# Patient Record
Sex: Male | Born: 1980 | Race: Black or African American | Hispanic: No | Marital: Single | Smoking: Current every day smoker
Health system: Southern US, Community
[De-identification: ages and names within clinical notes are randomized; demographics above are authoritative.]

## PROBLEM LIST (undated history)

## (undated) DIAGNOSIS — Z21 Asymptomatic human immunodeficiency virus [HIV] infection status: Secondary | ICD-10-CM

## (undated) DIAGNOSIS — Z7189 Other specified counseling: Secondary | ICD-10-CM

## (undated) DIAGNOSIS — B20 Human immunodeficiency virus [HIV] disease: Secondary | ICD-10-CM

## (undated) HISTORY — DX: Human immunodeficiency virus (HIV) disease: B20

## (undated) HISTORY — DX: Other specified counseling: Z71.89

## (undated) HISTORY — PX: NO PAST SURGERIES: SHX2092

## (undated) HISTORY — DX: Asymptomatic human immunodeficiency virus (hiv) infection status: Z21

---

## 2004-03-19 ENCOUNTER — Emergency Department (HOSPITAL_COMMUNITY): Admission: EM | Admit: 2004-03-19 | Discharge: 2004-03-19 | Payer: Self-pay | Admitting: Emergency Medicine

## 2009-05-07 ENCOUNTER — Emergency Department (HOSPITAL_COMMUNITY): Admission: EM | Admit: 2009-05-07 | Discharge: 2009-05-07 | Payer: Self-pay | Admitting: Emergency Medicine

## 2009-05-20 ENCOUNTER — Emergency Department (HOSPITAL_COMMUNITY): Admission: EM | Admit: 2009-05-20 | Discharge: 2009-05-20 | Payer: Self-pay | Admitting: Emergency Medicine

## 2010-09-16 LAB — DIFFERENTIAL
Eosinophils Relative: 1 % (ref 0–5)
Lymphocytes Relative: 28 % (ref 12–46)
Lymphs Abs: 1.6 10*3/uL (ref 0.7–4.0)
Monocytes Relative: 11 % (ref 3–12)
Neutrophils Relative %: 60 % (ref 43–77)

## 2010-09-16 LAB — URINALYSIS, ROUTINE W REFLEX MICROSCOPIC
Bilirubin Urine: NEGATIVE
Hgb urine dipstick: NEGATIVE
Ketones, ur: NEGATIVE mg/dL
Nitrite: NEGATIVE
Protein, ur: NEGATIVE mg/dL
Specific Gravity, Urine: 1.029 (ref 1.005–1.030)
Urobilinogen, UA: 1 mg/dL (ref 0.0–1.0)

## 2010-09-16 LAB — CBC
MCHC: 33.9 g/dL (ref 30.0–36.0)
MCV: 92.9 fL (ref 78.0–100.0)
RBC: 4.61 MIL/uL (ref 4.22–5.81)
RDW: 13 % (ref 11.5–15.5)

## 2010-09-16 LAB — COMPREHENSIVE METABOLIC PANEL
AST: 38 U/L — ABNORMAL HIGH (ref 0–37)
CO2: 27 mEq/L (ref 19–32)
Calcium: 8.5 mg/dL (ref 8.4–10.5)
Creatinine, Ser: 0.99 mg/dL (ref 0.4–1.5)
GFR calc Af Amer: >60 mL/min (ref >60)
GFR calc non Af Amer: >60 mL/min (ref >60)
Total Protein: 6.6 g/dL (ref 6.0–8.3)

## 2010-09-16 LAB — RPR: RPR Ser Ql: NONREACTIVE

## 2010-09-16 LAB — CHLAMYDIA TRACHOMATIS CULTURE

## 2010-09-16 LAB — GC/CHLAMYDIA PROBE AMP, GENITAL
Chlamydia, DNA Probe: NEGATIVE
GC Probe Amp, Genital: NEGATIVE

## 2010-09-16 LAB — GONOCOCCUS CULTURE: Culture: NO GROWTH

## 2011-10-07 ENCOUNTER — Encounter (HOSPITAL_COMMUNITY): Payer: Self-pay | Admitting: Emergency Medicine

## 2011-10-07 ENCOUNTER — Emergency Department (HOSPITAL_COMMUNITY)
Admission: EM | Admit: 2011-10-07 | Discharge: 2011-10-07 | Disposition: A | Discharge status: Home / Self Care | Payer: Self-pay | Attending: Emergency Medicine | Admitting: Emergency Medicine

## 2011-10-07 ENCOUNTER — Emergency Department (HOSPITAL_COMMUNITY): Payer: Self-pay

## 2011-10-07 DIAGNOSIS — J189 Pneumonia, unspecified organism: Secondary | ICD-10-CM | POA: Insufficient documentation

## 2011-10-07 LAB — POCT I-STAT, CHEM 8
Calcium, Ion: 1.07 mmol/L — ABNORMAL LOW (ref 1.12–1.32)
HCT: 42 % (ref 39.0–52.0)
TCO2: 26 mmol/L (ref 0–100)

## 2011-10-07 LAB — DIFFERENTIAL
Basophils Absolute: 0 10*3/uL (ref 0.0–0.1)
Basophils Relative: 0 % (ref 0–1)
Eosinophils Absolute: 0 10*3/uL (ref 0.0–0.7)
Eosinophils Relative: 0 % (ref 0–5)
Monocytes Absolute: 0.6 10*3/uL (ref 0.1–1.0)

## 2011-10-07 LAB — CBC
HCT: 39.9 % (ref 39.0–52.0)
MCHC: 35.3 g/dL (ref 30.0–36.0)
MCV: 90.1 fL (ref 78.0–100.0)
RDW: 12.3 % (ref 11.5–15.5)

## 2011-10-07 MED ORDER — SODIUM CHLORIDE 0.9 % IV BOLUS (SEPSIS)
1000.0000 mL | Freq: Once | INTRAVENOUS | Status: AC
  Administered 2011-10-07: 1000 mL via INTRAVENOUS

## 2011-10-07 MED ORDER — ACETAMINOPHEN 325 MG PO TABS
650.0000 mg | ORAL_TABLET | Freq: Once | ORAL | Status: DC
  Administered 2011-10-07: 650 mg via ORAL
  Filled 2011-10-07: qty 2

## 2011-10-07 MED ORDER — AZITHROMYCIN 250 MG PO TABS
500.0000 mg | ORAL_TABLET | Freq: Once | ORAL | Status: AC
  Administered 2011-10-07: 500 mg via ORAL
  Filled 2011-10-07: qty 2

## 2011-10-07 MED ORDER — IBUPROFEN 200 MG PO TABS: 600.0000 mg | ORAL_TABLET | Freq: Once | ORAL | Status: DC

## 2011-10-07 MED ORDER — AZITHROMYCIN 250 MG PO TABS: 250.0000 mg | ORAL_TABLET | Freq: Every day | ORAL | Status: AC

## 2011-10-07 NOTE — ED Notes (Signed)
Pt had already received tylenol prior to MD discontinuing the med and changing to ibuprofen

## 2011-10-07 NOTE — ED Notes (Signed)
MD resident at bedside to discuss disposition.

## 2011-10-07 NOTE — ED Notes (Signed)
Patient transported to X-ray 

## 2011-10-07 NOTE — ED Provider Notes (Signed)
History     CSN: [id number]  Arrival date & time 10/07/11  2001   First MD Initiated Contact with Patient 10/07/11 2050      Chief Complaint  Patient presents with  . Emesis    (Consider location/radiation/quality/duration/timing/severity/associated sxs/prior treatment) Patient is a 31 y.o. male presenting with general illness. The history is provided by the patient.  Illness  The current episode started 2 days ago. The problem occurs continuously. The problem has been unchanged. The problem is moderate. The symptoms are relieved by one or more OTC medications. The symptoms are aggravated by activity. Associated symptoms include a fever, diarrhea (single episode today), nausea, vomiting (x 2 today), rhinorrhea, muscle aches and cough. Pertinent negatives include no abdominal pain, no constipation, no sore throat, no swollen glands, no neck pain, no neck stiffness and no rash. The emesis has an appearance of stomach contents. He has been less active. He has been eating and drinking normally. There were no sick contacts. He has received no recent medical care.    No past medical history on file.  No past surgical history on file.  No family history on file.  History  Substance Use Topics  . Smoking status: Not on file  . Smokeless tobacco: Not on file  . Alcohol Use: Not on file      Review of Systems  Constitutional: Positive for fever and fatigue.  HENT: Positive for rhinorrhea. Negative for sore throat and neck pain.   Respiratory: Positive for cough.   Gastrointestinal: Positive for nausea, vomiting (x 2 today) and diarrhea (single episode today). Negative for abdominal pain and constipation.  Musculoskeletal: Positive for myalgias.  Skin: Negative for rash.  All other systems reviewed and are negative.    Allergies  Review of patient's allergies indicates no known allergies.  Home Medications   Current Outpatient Rx  Name Route Sig Dispense Refill  . OVER THE  COUNTER MEDICATION Oral Take 10 mLs by mouth every 4 (four) hours. Tylenol cold and flu    . PSEUDOEPH-DOXYLAMINE-DM-APAP 60-7.11-10-998 MG/30ML PO LIQD Oral Take 10 mLs by mouth every 4 (four) hours.      BP 130/67  Pulse 94  Temp(Src) 100.4 F (38 C) (Oral)  Resp 24  SpO2 96%  Physical Exam  Constitutional: He is oriented to person, place, and time. He appears well-developed and well-nourished.  HENT:  Head: Normocephalic and atraumatic.  Right Ear: Tympanic membrane normal.  Left Ear: Tympanic membrane normal.  Mouth/Throat: Oropharynx is clear and moist. No oropharyngeal exudate.  Eyes: Conjunctivae and EOM are normal. Pupils are equal, round, and reactive to light.  Neck: Normal range of motion. Neck supple.  Cardiovascular: Normal rate, regular rhythm and normal heart sounds.   Pulmonary/Chest: Effort normal and breath sounds normal. No respiratory distress.  Abdominal: Soft. He exhibits no distension. There is no tenderness. There is no rigidity and no guarding.  Musculoskeletal: Normal range of motion.  Neurological: He is alert and oriented to person, place, and time.  Skin: Skin is warm and dry.  Psychiatric: He has a normal mood and affect.    ED Course  Procedures (including critical care time)   Labs Reviewed  CBC  DIFFERENTIAL   Results for orders placed during the hospital encounter of 10/07/11  CBC      Component Value Range   WBC 8.0  4.0 - 10.5 (K/uL)   RBC 4.43  4.22 - 5.81 (MIL/uL)   Hemoglobin 14.1  13.0 - 17.0 (g/dL)  HCT 39.9  39.0 - 52.0 (%)   MCV 90.1  78.0 - 100.0 (fL)   MCH 31.8  26.0 - 34.0 (pg)   MCHC 35.3  30.0 - 36.0 (g/dL)   RDW 16.1  09.6 - 04.5 (%)   Platelets 242  150 - 400 (K/uL)  DIFFERENTIAL      Component Value Range   Neutrophils Relative 70  43 - 77 (%)   Neutro Abs 5.6  1.7 - 7.7 (K/uL)   Lymphocytes Relative 22  12 - 46 (%)   Lymphs Abs 1.7  0.7 - 4.0 (K/uL)   Monocytes Relative 8  3 - 12 (%)   Monocytes Absolute 0.6   0.1 - 1.0 (K/uL)   Eosinophils Relative 0  0 - 5 (%)   Eosinophils Absolute 0.0  0.0 - 0.7 (K/uL)   Basophils Relative 0  0 - 1 (%)   Basophils Absolute 0.0  0.0 - 0.1 (K/uL)  POCT I-STAT, CHEM 8      Component Value Range   Sodium 137  135 - 145 (mEq/L)   Potassium 3.8  3.5 - 5.1 (mEq/L)   Chloride 105  96 - 112 (mEq/L)   BUN 7  6 - 23 (mg/dL)   Creatinine, Ser 4.09  0.50 - 1.35 (mg/dL)   Glucose, Bld 811 (*) 70 - 99 (mg/dL)   Calcium, Ion 9.14 (*) 1.12 - 1.32 (mmol/L)   TCO2 26  0 - 100 (mmol/L)   Hemoglobin 14.3  13.0 - 17.0 (g/dL)   HCT 78.2  95.6 - 21.3 (%)    Dg Chest 2 View  10/07/2011  *RADIOLOGY REPORT*  Clinical Data: Fever and vomiting  CHEST - 2 VIEW  Comparison: None.  Findings: Diffuse bilateral airspace disease is present, most prominent in the bases.  No effusion.  Lung volume is normal. Heart size is mildly enlarged.  IMPRESSION: Bilateral airspace disease, probable pneumonia.  Original Report Authenticated By: Camelia Phenes, M.D.     1. Community acquired pneumonia       MDM   31 y/o M presents with day 3 of illness. Began with h/a, muscle aches, chills. Has been having fevers at home to Tmax 101. Also runny nose, cough. Cough rarely productive. No rash. So noted swollen lymph nodes.  Exam benign here and patient without distress. No focal source of bacterial infection on exam.   Labs benign.  CXR with concern of PNA. Fits with clinical picture. Likely atypical infection given appearance, age, presentation. Will start on azithro. Pt without hypoxia/tachypnea/tachycardia or comorbidities. Stable for outpt treatment.           Donnamarie Poag, MD 10/07/11 2151

## 2011-10-07 NOTE — ED Notes (Signed)
MD at bedside. 

## 2011-10-07 NOTE — ED Notes (Signed)
Pt reporting fevers and feeling malaise since Tuesday. Vomiting and diarrh starting today. Body aches, chills.

## 2011-10-09 NOTE — ED Provider Notes (Signed)
I saw and evaluated the patient, reviewed the resident's note and I agree with the findings and plan.   .Face to face Exam:  General:  Awake HEENT:  Atraumatic Resp:  Normal effort Abd:  Nondistended Neuro:No focal weakness Lymph: No adenopathy   Vendela Troung L Sian Rockers, MD 10/09/11 0908 

## 2013-07-12 IMAGING — CR DG CHEST 2V
2 series · 2 of 2 positions shown · non-contrast
Comparison: None.

CLINICAL DATA: Fever and vomiting

CHEST - 2 VIEW

[w chest pa]
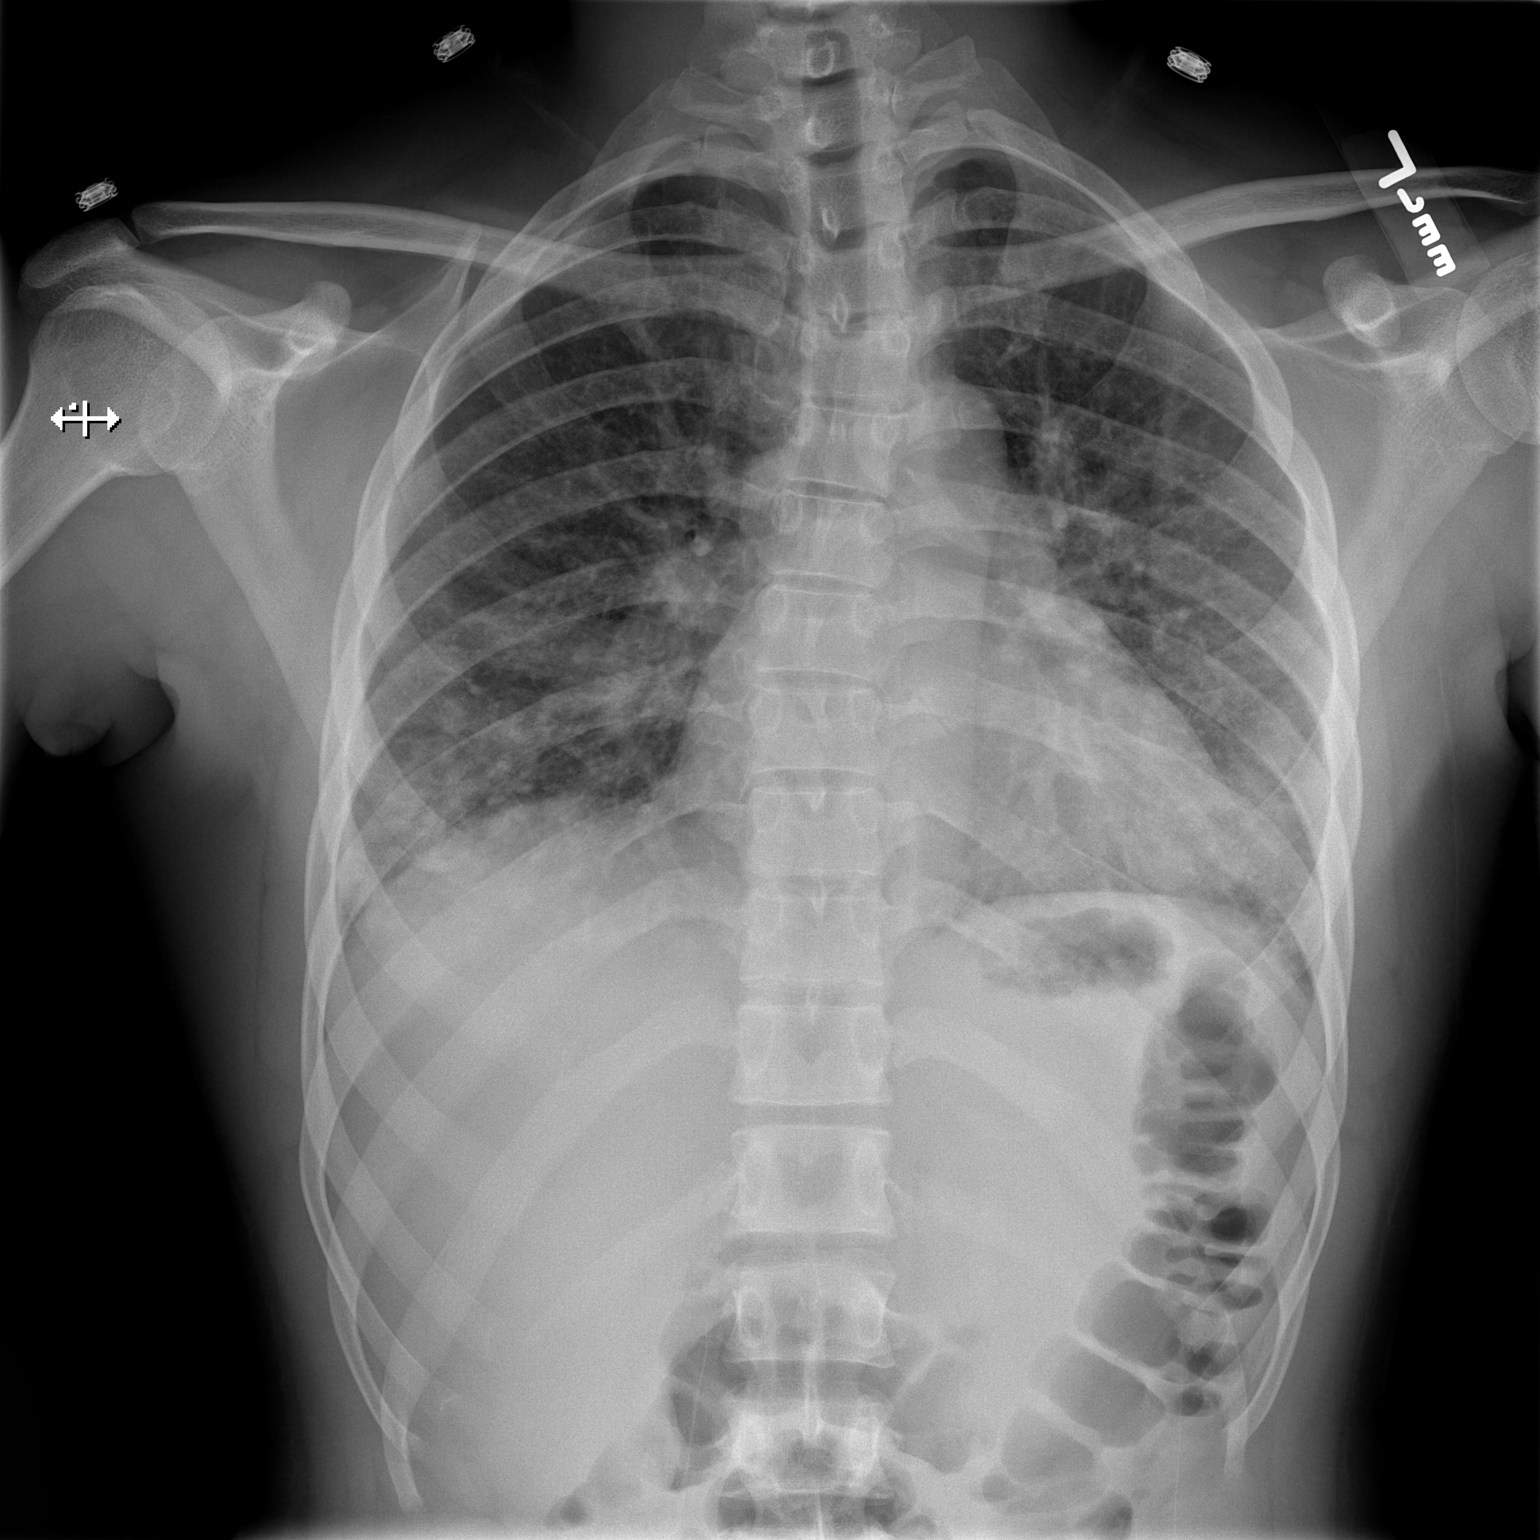

[w chest lat]
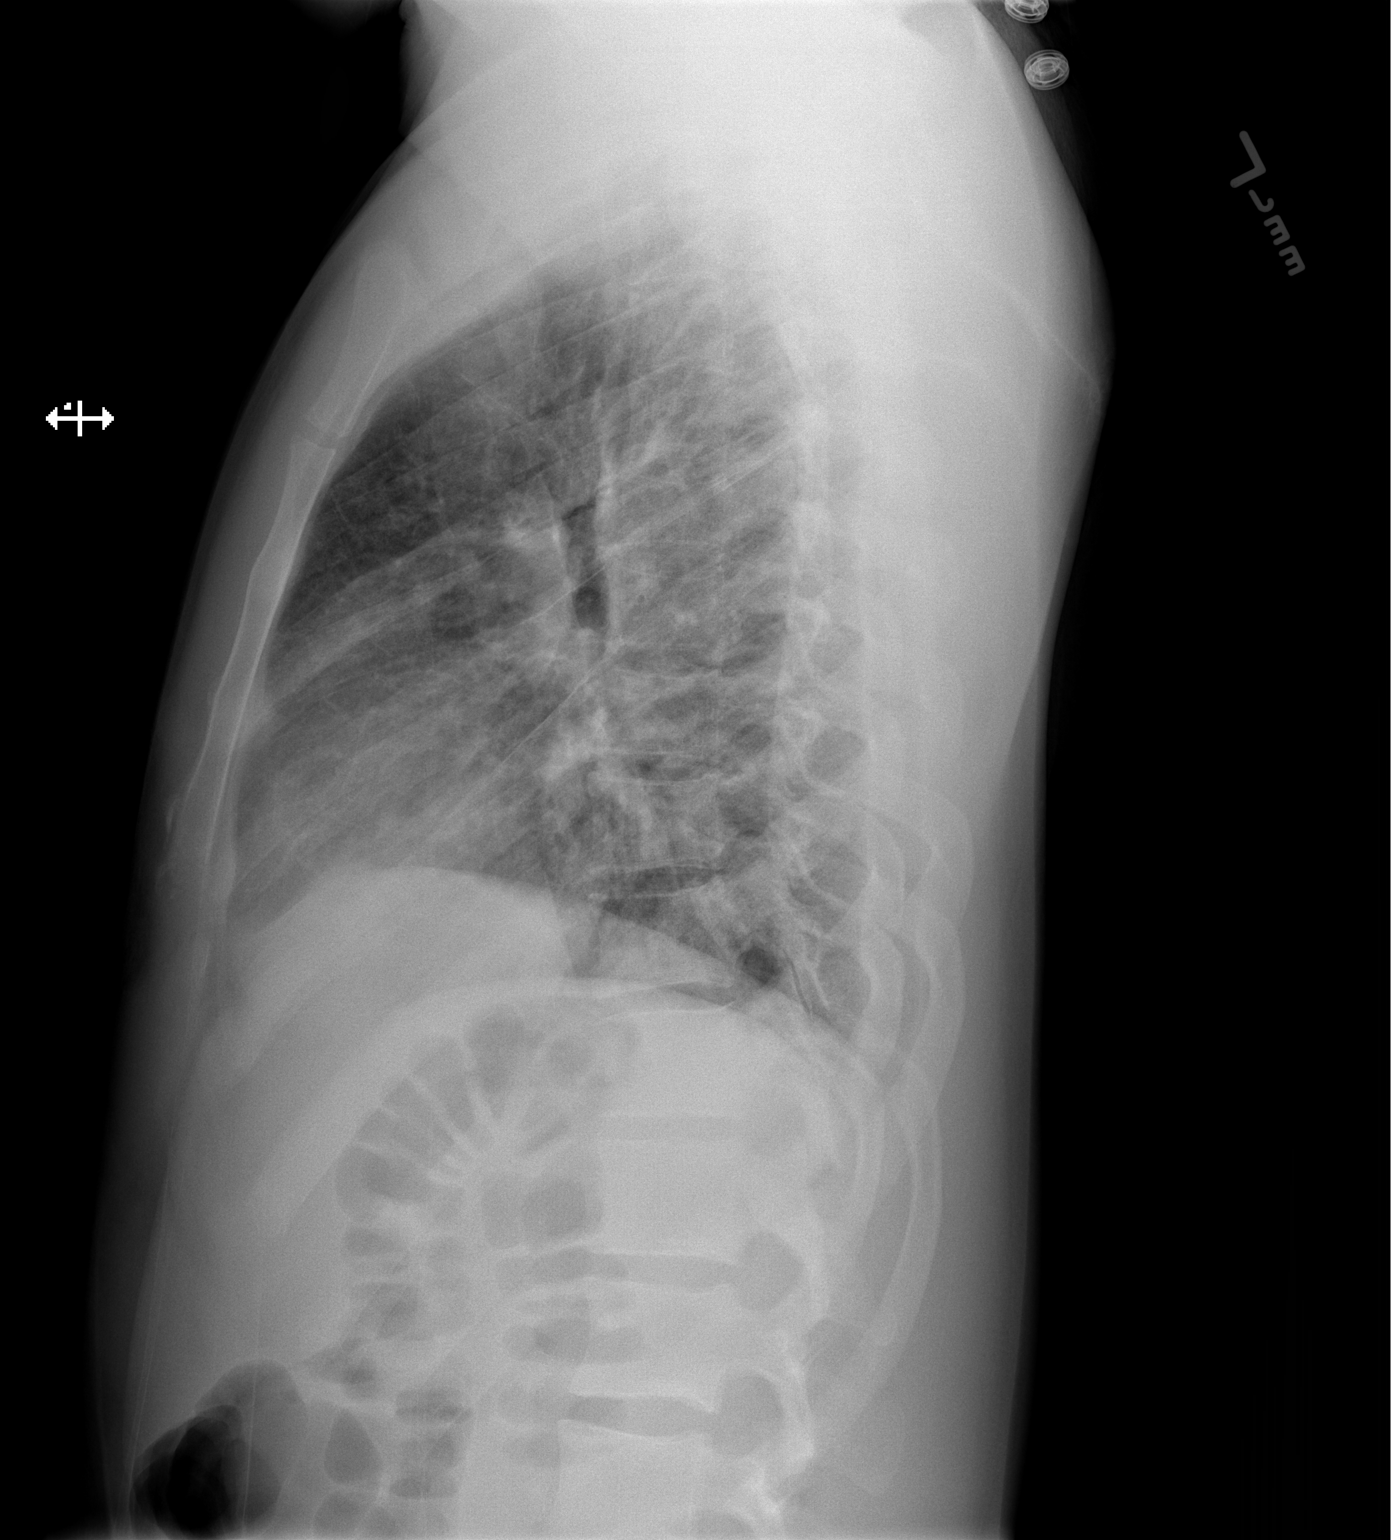

[2 of 2 positions shown; findings below may reference images not displayed]

FINDINGS: Diffuse bilateral airspace disease is present, most
prominent in the bases.  No effusion.  Lung volume is normal.
Heart size is mildly enlarged.
IMPRESSION: Bilateral airspace disease, probable pneumonia.

## 2015-09-16 ENCOUNTER — Emergency Department (HOSPITAL_COMMUNITY): Payer: Self-pay

## 2015-09-16 ENCOUNTER — Encounter (HOSPITAL_COMMUNITY): Payer: Self-pay | Admitting: Emergency Medicine

## 2015-09-16 ENCOUNTER — Emergency Department (HOSPITAL_COMMUNITY)
Admission: EM | Admit: 2015-09-16 | Discharge: 2015-09-16 | Disposition: A | Discharge status: Home / Self Care | Payer: Self-pay | Attending: Emergency Medicine | Admitting: Emergency Medicine

## 2015-09-16 DIAGNOSIS — Y9389 Activity, other specified: Secondary | ICD-10-CM | POA: Insufficient documentation

## 2015-09-16 DIAGNOSIS — S93402A Sprain of unspecified ligament of left ankle, initial encounter: Secondary | ICD-10-CM | POA: Insufficient documentation

## 2015-09-16 DIAGNOSIS — X501XXA Overexertion from prolonged static or awkward postures, initial encounter: Secondary | ICD-10-CM | POA: Insufficient documentation

## 2015-09-16 DIAGNOSIS — Y9289 Other specified places as the place of occurrence of the external cause: Secondary | ICD-10-CM | POA: Insufficient documentation

## 2015-09-16 DIAGNOSIS — F17200 Nicotine dependence, unspecified, uncomplicated: Secondary | ICD-10-CM | POA: Insufficient documentation

## 2015-09-16 DIAGNOSIS — Y998 Other external cause status: Secondary | ICD-10-CM | POA: Insufficient documentation

## 2015-09-16 MED ORDER — ACETAMINOPHEN 325 MG PO TABS
650.0000 mg | ORAL_TABLET | Freq: Once | ORAL | Status: AC
  Administered 2015-09-16: 650 mg via ORAL
  Filled 2015-09-16: qty 2

## 2015-09-16 MED ORDER — NAPROXEN 500 MG PO TABS: 500.0000 mg | ORAL_TABLET | Freq: Two times a day (BID) | ORAL | Status: DC

## 2015-09-16 NOTE — Discharge Instructions (Signed)
Your x-ray today showed no evidence of fracture or dislocation. You likely have an ankle strain/sprain. Please call Dr. Eliberto IvoryBlackman's office to schedule an orthopedic follow up. In the meantime you may take naproxen as needed for pain. Keep your foot elevated at home when resting. Ice on and off for the next 48 hours. Return to the ER for new or worsening symptoms.   Ankle Sprain An ankle sprain is an injury to the strong, fibrous tissues (ligaments) that hold the bones of your ankle joint together.  CAUSES An ankle sprain is usually caused by a fall or by twisting your ankle. Ankle sprains most commonly occur when you step on the outer edge of your foot, and your ankle turns inward. People who participate in sports are more prone to these types of injuries.  SYMPTOMS   Pain in your ankle. The pain may be present at rest or only when you are trying to stand or walk.  Swelling.  Bruising. Bruising may develop immediately or within 1 to 2 days after your injury.  Difficulty standing or walking, particularly when turning corners or changing directions. DIAGNOSIS  Your caregiver will ask you details about your injury and perform a physical exam of your ankle to determine if you have an ankle sprain. During the physical exam, your caregiver will press on and apply pressure to specific areas of your foot and ankle. Your caregiver will try to move your ankle in certain ways. An X-ray exam may be done to be sure a bone was not broken or a ligament did not separate from one of the bones in your ankle (avulsion fracture).  TREATMENT  Certain types of braces can help stabilize your ankle. Your caregiver can make a recommendation for this. Your caregiver may recommend the use of medicine for pain. If your sprain is severe, your caregiver may refer you to a surgeon who helps to restore function to parts of your skeletal system (orthopedist) or a physical therapist. HOME CARE INSTRUCTIONS   Apply ice to your  injury for 1-2 days or as directed by your caregiver. Applying ice helps to reduce inflammation and pain.  Put ice in a plastic bag.  Place a towel between your skin and the bag.  Leave the ice on for 15-20 minutes at a time, every 2 hours while you are awake.  Only take over-the-counter or prescription medicines for pain, discomfort, or fever as directed by your caregiver.  Elevate your injured ankle above the level of your heart as much as possible for 2-3 days.  If your caregiver recommends crutches, use them as instructed. Gradually put weight on the affected ankle. Continue to use crutches or a cane until you can walk without feeling pain in your ankle.  If you have a plaster splint, wear the splint as directed by your caregiver. Do not rest it on anything harder than a pillow for the first 24 hours. Do not put weight on it. Do not get it wet. You may take it off to take a shower or bath.  You may have been given an elastic bandage to wear around your ankle to provide support. If the elastic bandage is too tight (you have numbness or tingling in your foot or your foot becomes cold and blue), adjust the bandage to make it comfortable.  If you have an air splint, you may blow more air into it or let air out to make it more comfortable. You may take your splint off at night  and before taking a shower or bath. Wiggle your toes in the splint several times per day to decrease swelling. SEEK MEDICAL CARE IF:   You have rapidly increasing bruising or swelling.  Your toes feel extremely cold or you lose feeling in your foot.  Your pain is not relieved with medicine. SEEK IMMEDIATE MEDICAL CARE IF:  Your toes are numb or blue.  You have severe pain that is increasing. MAKE SURE YOU:   Understand these instructions.  Will watch your condition.  Will get help right away if you are not doing well or get worse.   This information is not intended to replace advice given to you by your  health care provider. Make sure you discuss any questions you have with your health care provider.   Document Released: 05/31/2005 Document Revised: 06/21/2014 Document Reviewed: 06/12/2011 Elsevier Interactive Patient Education [employer]2016 [employer] Inc.

## 2015-09-16 NOTE — ED Provider Notes (Signed)
History  By signing my name below, I, Karle Plumber, attest that this documentation has been prepared under the direction and in the presence of Pansey Pinheiro, [place]. Electronically Signed: Karle Plumber, ED Scribe. 09/16/2015. 12:40 PM.  Chief Complaint  Patient presents with  . Ankle Injury   The history is provided by the patient and medical records. No language interpreter was used.    HPI Comments:  Duane Watson is a 35 y.o. male who presents to the Emergency Department complaining of left ankle pain that began last night secondary to inverting it while missing a step while walking down stairs. He reports associated swelling of the left ankle. He reports taking an Aleve about two hours ago with moderate relief of the pain. He has been able to bear weight but it increases his pain. He denies alleviating factors. He denies nausea, vomiting, bruising, wounds, numbness, tingling or weakness of the left ankle or foot.   History reviewed. No pertinent past medical history. History reviewed. No pertinent past surgical history. No family history on file. Social History  Substance Use Topics  . Smoking status: Current Every Day Smoker  . Smokeless tobacco: None  . Alcohol Use: Yes    Review of Systems A complete 10 system review of systems was obtained and all systems are negative except as noted in the HPI and PMH.   Allergies  Review of patient's allergies indicates no known allergies.  Home Medications   Prior to Admission medications   Medication Sig Start Date End Date Taking? Authorizing Provider  OVER THE COUNTER MEDICATION Take 10 mLs by mouth every 4 (four) hours. Tylenol cold and flu    Historical Provider, MD  Pseudoeph-Doxylamine-DM-APAP (NYQUIL) 60-7.11-10-998 MG/30ML LIQD Take 10 mLs by mouth every 4 (four) hours.    Historical Provider, MD   Triage Vitals: BP 136/73 mmHg  Pulse 69  Temp(Src) 98.5 F (36.9 C) (Oral)  Resp 18  SpO2 99% Physical Exam   Constitutional: He is oriented to person, place, and time. He appears well-developed and well-nourished.  HENT:  Head: Normocephalic and atraumatic.  Eyes: EOM are normal.  Neck: Normal range of motion.  Cardiovascular: Normal rate.   Pulmonary/Chest: Effort normal.  Musculoskeletal: Normal range of motion.  Left ankle and posterior lateral foot with diffuse mild edema. Ankle is diffusely TTP. Full flexion and extension of ankle. Limited inversion and eversion 2/2 pain. 2+ distal pulses. Brisk cap refill.  Neurological: He is alert and oriented to person, place, and time.  Skin: Skin is warm and dry.  Psychiatric: He has a normal mood and affect. His behavior is normal.  Nursing note and vitals reviewed.   ED Course  Procedures (including critical care time) DIAGNOSTIC STUDIES: Oxygen Saturation is 99% on RA, normal by my interpretation.   COORDINATION OF CARE: 11:44 AM- Will X-Ray left ankle and order Tylenol prior to imaging. Pt verbalizes understanding and agrees to plan.  Medications  acetaminophen (TYLENOL) tablet 650 mg (650 mg Oral Given 09/16/15 1148)   Imaging Review Dg Ankle Complete Left  09/16/2015  CLINICAL DATA:  Twisted ankle on steps last evening. Lateral and anterior ankle pain. EXAM: LEFT ANKLE COMPLETE - 3+ VIEW COMPARISON:  None. FINDINGS: There is no evidence of fracture, dislocation, or joint effusion. There is no evidence of arthropathy or other focal bone abnormality. Soft tissues are unremarkable. IMPRESSION: Negative. Electronically Signed   By: Marin Roberts M.D.   On: 09/16/2015 12:20   I have personally reviewed and evaluated  these images and lab results as part of my medical decision-making.   MDM   Final diagnoses:  Left ankle sprain, initial encounter    XR negative for acute findings. Suspect strain/sprain of lateral ankle. Brace and crutches given. Encouraged NSAIDs as needed for pain and rx given for naproxen. Encouraged RICE therapy.  Instructed to f/u with ortho. ER return precautions given.  I personally performed the services described in this documentation, which was scribed in my presence. The recorded information has been reviewed and is accurate.    Carlene CoriaSerena Y Sanvika Cuttino, PA-C 09/16/15 1400  Raeford RazorStephen Kohut, MD 09/18/15 0830

## 2015-09-16 NOTE — ED Notes (Signed)
Ice applied to left ankle.

## 2015-09-16 NOTE — ED Notes (Signed)
Twisted left ankle last pm while going down steps.

## 2016-05-04 ENCOUNTER — Encounter (HOSPITAL_COMMUNITY): Payer: Self-pay | Admitting: *Deleted

## 2016-05-04 DIAGNOSIS — J111 Influenza due to unidentified influenza virus with other respiratory manifestations: Secondary | ICD-10-CM | POA: Insufficient documentation

## 2016-05-04 DIAGNOSIS — F17200 Nicotine dependence, unspecified, uncomplicated: Secondary | ICD-10-CM | POA: Insufficient documentation

## 2016-05-04 NOTE — ED Triage Notes (Signed)
The p[t has had generalized body aches with a cold sorehtroat nuasea diarrhea chills sweating chest and head congestion temp  Last ibu was 1200 noon today

## 2016-05-05 ENCOUNTER — Emergency Department (HOSPITAL_COMMUNITY)
Admission: EM | Admit: 2016-05-05 | Discharge: 2016-05-05 | Disposition: A | Discharge status: Home / Self Care | Payer: Self-pay | Attending: Emergency Medicine | Admitting: Emergency Medicine

## 2016-05-05 DIAGNOSIS — J111 Influenza due to unidentified influenza virus with other respiratory manifestations: Secondary | ICD-10-CM

## 2016-05-05 DIAGNOSIS — R69 Illness, unspecified: Secondary | ICD-10-CM

## 2016-05-05 MED ORDER — HYDROCODONE-ACETAMINOPHEN 5-325 MG PO TABS: ORAL_TABLET | ORAL | 0 refills | Status: DC

## 2016-05-05 MED ORDER — HYDROCODONE-ACETAMINOPHEN 5-325 MG PO TABS
1.0000 | ORAL_TABLET | Freq: Once | ORAL | Status: AC
  Administered 2016-05-05: 1 via ORAL
  Filled 2016-05-05: qty 1

## 2016-05-05 MED ORDER — ONDANSETRON HCL 4 MG PO TABS: 4.0000 mg | ORAL_TABLET | Freq: Four times a day (QID) | ORAL | 0 refills | Status: DC

## 2016-05-05 MED ORDER — ONDANSETRON 4 MG PO TBDP
4.0000 mg | ORAL_TABLET | Freq: Once | ORAL | Status: AC
  Administered 2016-05-05: 4 mg via ORAL
  Filled 2016-05-05: qty 1

## 2016-05-05 NOTE — Discharge Instructions (Signed)

## 2016-05-05 NOTE — ED Notes (Signed)
Asking about waitime  given

## 2016-05-05 NOTE — ED Notes (Signed)
Pt verbalized understanding of d/c instructions and has no further questions. Pt is stable, A&Ox4, VSS.  

## 2016-05-05 NOTE — ED Provider Notes (Signed)
[facility]-EMERGENCY DEPT Provider Note   CSN: [id number] Arrival date & time: 05/04/16  2245     History   Chief Complaint Chief Complaint  Patient presents with  . Generalized Body Aches     HPI   Blood pressure 118/69, pulse 94, temperature 99.6 F (37.6 C), temperature source Oral, resp. rate 18, height 6' (1.829 m), weight 84.7 kg, SpO2 98 %.  Duane FitchDavid Watson is a 35 y.o. male complaining of tactile fever (temperature 100.2 measured yesterday), generalized headache, sore throat, nausea with diarrhea, rhinorrhea onset 4 days ago. Patient otherwise healthy, no lung pathology or immunosuppression. Patient reports reduced by mouth intake but states he is drinking regularly with normal urine output. He denies focal abdominal pain, vomiting, cough, shortness of breath he does report some pleuritic chest pain with no history of DVT/PE, shortness of breath, recent mobilizations, calf pain, leg swelling. He's been taking Tylenol and ibuprofen at home with some relief. Did not receive flu shot this year, no sick contacts but patient's boyfriend works in a primary school.    History reviewed. No pertinent past medical history.  There are no active problems to display for this patient.   History reviewed. No pertinent surgical history.     Home Medications    Prior to Admission medications   Medication Sig Start Date End Date Taking? Authorizing Provider  HYDROcodone-acetaminophen (NORCO/VICODIN) 5-325 MG tablet Take 1-2 tablets by mouth every 6 hours as needed for pain and/or cough. 05/05/16   Carmyn Hamm, PA-C  naproxen (NAPROSYN) 500 MG tablet Take 1 tablet (500 mg total) by mouth 2 (two) times daily. 09/16/15   Ace GinsSerena Y Sam, PA-C  ondansetron (ZOFRAN) 4 MG tablet Take 1 tablet (4 mg total) by mouth every 6 (six) hours. 05/05/16   Dayn Barich, PA-C  OVER THE COUNTER MEDICATION Take 10 mLs by mouth every 4 (four) hours. Tylenol cold and flu    Historical Provider, MD    Pseudoeph-Doxylamine-DM-APAP (NYQUIL) 60-7.11-10-998 MG/30ML LIQD Take 10 mLs by mouth every 4 (four) hours.    Historical Provider, MD    Family History No family history on file.  Social History Social History  Substance Use Topics  . Smoking status: Current Every Day Smoker  . Smokeless tobacco: Never Used  . Alcohol use Yes     Allergies   Patient has no known allergies.   Review of Systems Review of Systems  10 systems reviewed and found to be negative, except as noted in the HPI.  Physical Exam Updated Vital Signs BP 118/69 (BP Location: Right Arm)   Pulse 94   Temp 99.6 F (37.6 C) (Oral)   Resp 18   Ht 6' (1.829 m)   Wt 84.7 kg   SpO2 98%   BMI 25.33 kg/m   Physical Exam  Constitutional: He is oriented to person, place, and time. He appears well-developed and well-nourished. No distress.  HENT:  Head: Normocephalic.  Right Ear: External ear normal.  Left Ear: External ear normal.  Mouth/Throat: Oropharynx is clear and moist. No oropharyngeal exudate.  No drooling or stridor. Posterior pharynx mildly erythematous no significant tonsillar hypertrophy. No exudate. Soft palate rises symmetrically. No TTP or induration under tongue.   No tenderness to palpation of frontal or bilateral maxillary sinuses.  Mild mucosal edema in the nares with scant rhinorrhea.  Bilateral tympanic membranes with normal architecture and good light reflex.    Eyes: Conjunctivae and EOM are normal. Pupils are equal, round, and reactive to light.  Neck: Normal range of motion. Neck supple. No JVD present. No tracheal deviation present.  Cardiovascular: Normal rate, regular rhythm and intact distal pulses.   Radial pulse equal bilaterally  Pulmonary/Chest: Effort normal and breath sounds normal. No stridor. No respiratory distress. He has no wheezes. He has no rales. He exhibits no tenderness.  Abdominal: Soft. He exhibits no distension and no mass. There is no tenderness. There  is no rebound and no guarding.  Musculoskeletal: Normal range of motion. He exhibits no edema or tenderness.  No calf asymmetry, superficial collaterals, palpable cords, edema, Homans sign negative bilaterally.    Neurological: He is alert and oriented to person, place, and time.  Skin: Skin is warm. He is not diaphoretic.  Psychiatric: He has a normal mood and affect.  Nursing note and vitals reviewed.    ED Treatments / Results  Labs (all labs ordered are listed, but only abnormal results are displayed) Labs Reviewed - No data to display  EKG  EKG Interpretation None       Radiology No results found.  Procedures Procedures (including critical care time)  Medications Ordered in ED Medications  HYDROcodone-acetaminophen (NORCO/VICODIN) 5-325 MG per tablet 1 tablet (not administered)  ondansetron (ZOFRAN-ODT) disintegrating tablet 4 mg (not administered)     Initial Impression / Assessment and Plan / ED Course  I have reviewed the triage vital signs and the nursing notes.  Pertinent labs & imaging results that were available during my care of the patient were reviewed by me and considered in my medical decision making (see chart for details).  Clinical Course     Vitals:   05/04/16 2250  BP: 118/69  Pulse: 94  Resp: 18  Temp: 99.6 F (37.6 C)  TempSrc: Oral  SpO2: 98%  Weight: 84.7 kg  Height: 6' (1.829 m)    Medications  HYDROcodone-acetaminophen (NORCO/VICODIN) 5-325 MG per tablet 1 tablet (not administered)  ondansetron (ZOFRAN-ODT) disintegrating tablet 4 mg (not administered)    Duane Watson is 35 y.o. male presenting with NAD, Non-toxic appearing, AFVSS, LSCTA. Lung sounds clear. Saturating well on room air, no tachypnea or tachycardia, likely influenza. Counseled him on aggressive hydration, fever control.   Evaluation does not show pathology that would require ongoing emergent intervention or inpatient treatment. Pt is hemodynamically stable and  mentating appropriately. Discussed findings and plan with patient/guardian, who agrees with care plan. All questions answered. Return precautions discussed and outpatient follow up given.    Final Clinical Impressions(s) / ED Diagnoses   Final diagnoses:  Influenza-like illness    New Prescriptions New Prescriptions   HYDROCODONE-ACETAMINOPHEN (NORCO/VICODIN) 5-325 MG TABLET    Take 1-2 tablets by mouth every 6 hours as needed for pain and/or cough.   ONDANSETRON (ZOFRAN) 4 MG TABLET    Take 1 tablet (4 mg total) by mouth every 6 (six) hours.     Wynetta Emeryicole Leonel Mccollum, PA-C 05/05/16 [id number]    Glynn OctaveStephen Rancour, MD 05/05/16 [phone]

## 2016-05-13 ENCOUNTER — Encounter (HOSPITAL_COMMUNITY): Payer: Self-pay | Admitting: Emergency Medicine

## 2016-05-13 DIAGNOSIS — L02214 Cutaneous abscess of groin: Secondary | ICD-10-CM | POA: Insufficient documentation

## 2016-05-13 DIAGNOSIS — F17200 Nicotine dependence, unspecified, uncomplicated: Secondary | ICD-10-CM | POA: Insufficient documentation

## 2016-05-13 NOTE — ED Triage Notes (Signed)
Patient reports skin abscess at pubic area onset 2 weeks with no drainage , denies fever or chills .

## 2016-05-14 ENCOUNTER — Emergency Department (HOSPITAL_COMMUNITY)
Admission: EM | Admit: 2016-05-14 | Discharge: 2016-05-14 | Disposition: A | Discharge status: Home / Self Care | Payer: Self-pay | Attending: Emergency Medicine | Admitting: Emergency Medicine

## 2016-05-14 DIAGNOSIS — L0291 Cutaneous abscess, unspecified: Secondary | ICD-10-CM

## 2016-05-14 MED ORDER — HYDROCODONE-ACETAMINOPHEN 5-325 MG PO TABS: ORAL_TABLET | ORAL | 0 refills | Status: DC

## 2016-05-14 MED ORDER — OXYCODONE-ACETAMINOPHEN 5-325 MG PO TABS
1.0000 | ORAL_TABLET | Freq: Once | ORAL | Status: AC
  Administered 2016-05-14: 1 via ORAL
  Filled 2016-05-14: qty 1

## 2016-05-14 MED ORDER — LIDOCAINE-EPINEPHRINE (PF) 2 %-1:200000 IJ SOLN
10.0000 mL | Freq: Once | INTRAMUSCULAR | Status: AC
  Administered 2016-05-14: 10 mL via INTRADERMAL
  Filled 2016-05-14: qty 20

## 2016-05-14 MED ORDER — IBUPROFEN 600 MG PO TABS: 600.0000 mg | ORAL_TABLET | Freq: Four times a day (QID) | ORAL | 0 refills | Status: DC | PRN

## 2016-05-14 NOTE — ED Provider Notes (Signed)
[facility] Provider Note   CSN: [id number] Arrival date & time: 05/13/16  2336     History   Chief Complaint Chief Complaint  Patient presents with  . Abscess    HPI Duane Watson is a 35 y.o. male.  Presents with abcess in groin x 2 weeks, worsening over time, growing in size and pain. No drainage, fever. No pain or difficulty urinating. No pain that extends to scrotum or thigh. No abdominal pain.   The history is provided by the patient. No language interpreter was used.  Abscess  Location:  Pelvis Pelvic abscess location:  Groin Abscess quality: not draining   Red streaking: no   Duration:  2 weeks Associated symptoms: no fever and no nausea     History reviewed. No pertinent past medical history.  There are no active problems to display for this patient.   History reviewed. No pertinent surgical history.     Home Medications    Prior to Admission medications   Medication Sig Start Date End Date Taking? Authorizing Provider  HYDROcodone-acetaminophen (NORCO/VICODIN) 5-325 MG tablet Take 1-2 tablets by mouth every 6 hours as needed for pain and/or cough. Patient not taking: Reported on 05/14/2016 05/05/16   Joni ReiningNicole Pisciotta, PA-C  naproxen (NAPROSYN) 500 MG tablet Take 1 tablet (500 mg total) by mouth 2 (two) times daily. Patient not taking: Reported on 05/14/2016 09/16/15   Ace GinsSerena Y Sam, PA-C  ondansetron (ZOFRAN) 4 MG tablet Take 1 tablet (4 mg total) by mouth every 6 (six) hours. Patient not taking: Reported on 05/14/2016 05/05/16   Wynetta EmeryNicole Pisciotta, PA-C    Family History No family history on file.  Social History Social History  Substance Use Topics  . Smoking status: Current Every Day Smoker  . Smokeless tobacco: Never Used  . Alcohol use Yes     Allergies   Patient has no known allergies.   Review of Systems Review of Systems  Constitutional: Negative for fever.  Gastrointestinal: Negative for nausea.  Genitourinary: Negative for  dysuria, penile pain, scrotal swelling and testicular pain.  Musculoskeletal: Negative for myalgias.  Skin: Positive for wound.     Physical Exam Updated Vital Signs BP 131/71 (BP Location: Left Arm)   Pulse 82   Temp 98.1 F (36.7 C) (Oral)   Resp 16   Ht 6' (1.829 m)   Wt 84.8 kg   SpO2 100%   BMI 25.36 kg/m   Physical Exam  Constitutional: He appears well-developed and well-nourished. No distress.  Abdominal: There is no tenderness.  Lymphadenopathy:       Right: No inguinal adenopathy present.       Left: No inguinal adenopathy present.  Skin:  Painful, swollen area on pubic mons with fluctuant center c/w cutaneous abscess.      ED Treatments / Results  Labs (all labs ordered are listed, but only abnormal results are displayed) Labs Reviewed - No data to display  EKG  EKG Interpretation None       Radiology No results found.  Procedures Procedures (including critical care time) INCISION AND DRAINAGE Performed by: Elpidio AnisUPSTILL, Laure Leone A Consent: Verbal consent obtained. Risks and benefits: risks, benefits and alternatives were discussed Type: abscess  Body area: groin  Anesthesia: local infiltration  Incision was made with a scalpel.  Local anesthetic: lidocaine 2% w/ epinephrine  Anesthetic total: 2 ml  Complexity: complex Blunt dissection to break up loculations  Drainage: purulent  Drainage amount: moderate, purulent, bloody  Packing material: none  Patient  tolerance: Patient tolerated the procedure well with no immediate complications.    Medications Ordered in ED Medications  lidocaine-EPINEPHrine (XYLOCAINE W/EPI) 2 %-1:200000 (PF) injection 10 mL (10 mLs Intradermal Given 05/14/16 0242)  oxyCODONE-acetaminophen (PERCOCET/ROXICET) 5-325 MG per tablet 1 tablet (1 tablet Oral Given 05/14/16 0343)     Initial Impression / Assessment and Plan / ED Course  I have reviewed the triage vital signs and the nursing notes.  Pertinent labs &  imaging results that were available during my care of the patient were reviewed by me and considered in my medical decision making (see chart for details).  Clinical Course     Uncomplicated cutaneous abscess requiring I&D.    Final Clinical Impressions(s) / ED Diagnoses   Final diagnoses:  None   1. abscess  New Prescriptions New Prescriptions   No medications on file     Elpidio AnisShari Leveta Wahab, PA-C 05/14/16 0410    Glynn OctaveStephen Rancour, MD 05/14/16 [phone]

## 2017-02-15 ENCOUNTER — Encounter: Payer: Self-pay | Admitting: Internal Medicine

## 2017-02-15 ENCOUNTER — Other Ambulatory Visit (HOSPITAL_COMMUNITY)
Admission: RE | Admit: 2017-02-15 | Discharge: 2017-02-15 | Disposition: A | Discharge status: Home / Self Care | Payer: BLUE CROSS/BLUE SHIELD | Source: Ambulatory Visit | Attending: Internal Medicine | Admitting: Internal Medicine

## 2017-02-15 ENCOUNTER — Ambulatory Visit (INDEPENDENT_AMBULATORY_CARE_PROVIDER_SITE_OTHER): Payer: BLUE CROSS/BLUE SHIELD | Admitting: Internal Medicine

## 2017-02-15 ENCOUNTER — Other Ambulatory Visit: Payer: Self-pay | Admitting: Internal Medicine

## 2017-02-15 VITALS — BP 123/73 | HR 93 | Temp 98.3°F | Wt 178.0 lb

## 2017-02-15 DIAGNOSIS — Z23 Encounter for immunization: Secondary | ICD-10-CM | POA: Diagnosis not present

## 2017-02-15 DIAGNOSIS — Z7185 Encounter for immunization safety counseling: Secondary | ICD-10-CM

## 2017-02-15 DIAGNOSIS — Z7189 Other specified counseling: Secondary | ICD-10-CM | POA: Diagnosis not present

## 2017-02-15 DIAGNOSIS — B20 Human immunodeficiency virus [HIV] disease: Secondary | ICD-10-CM | POA: Insufficient documentation

## 2017-02-15 HISTORY — DX: Encounter for immunization safety counseling: Z71.85

## 2017-02-15 LAB — CBC WITH DIFFERENTIAL/PLATELET
BASOS ABS: 0 {cells}/uL (ref 0–200)
BASOS PCT: 0 %
EOS ABS: 52 {cells}/uL (ref 15–500)
Eosinophils Relative: 1 %
HEMATOCRIT: 41.3 % (ref 38.5–50.0)
HEMOGLOBIN: 13.7 g/dL (ref 13.2–17.1)
LYMPHS ABS: 3224 {cells}/uL (ref 850–3900)
Lymphocytes Relative: 62 %
MCH: 30.7 pg (ref 27.0–33.0)
MCHC: 33.2 g/dL (ref 32.0–36.0)
MCV: 92.6 fL (ref 80.0–100.0)
MONO ABS: 312 {cells}/uL (ref 200–950)
MPV: 9.9 fL (ref 7.5–12.5)
Monocytes Relative: 6 %
NEUTROS ABS: 1612 {cells}/uL (ref 1500–7800)
Neutrophils Relative %: 31 %
Platelets: 284 10*3/uL (ref 140–400)
RBC: 4.46 MIL/uL (ref 4.20–5.80)
RDW: 13.6 % (ref 11.0–15.0)
WBC: 5.2 10*3/uL (ref 3.8–10.8)

## 2017-02-15 MED ORDER — BICTEGRAVIR-EMTRICITAB-TENOFOV 50-200-25 MG PO TABS: 1.0000 | ORAL_TABLET | Freq: Every day | ORAL | 11 refills | Status: DC

## 2017-02-15 NOTE — Progress Notes (Signed)
Patient ID: Duane FitchDavid Watson, male    DOB: [birth year], 36 y.o.   MRN: [id number]  Reason for visit: to establish care as a new patient with HIV  HPI:   Patient was first diagnosed in July 2018 during [facility].  He was tested as part routine screen.  Previously tested negative about 2 years piro.  The CD4 count is unknown, viral load unknown.  There have been no associated symptoms.  He endorses MSM, history of gonorrhea about 3 years ago.  No other STIs.  No weight loss, no diarrhea.  No other medical problems.  Has family support with his sister, who is a Engineer, civil (consulting)nurse.    PMH: HIV  Prior to Admission medications   Medication Sig Start Date End Date Taking? Authorizing Provider  ibuprofen (ADVIL,MOTRIN) 600 MG tablet Take 1 tablet (600 mg total) by mouth every 6 (six) hours as needed. 05/14/16  Yes Upstill, Melvenia BeamShari, PA-C  bictegravir-emtricitabine-tenofovir AF (BIKTARVY) 50-200-25 MG TABS tablet Take 1 tablet by mouth daily. 02/15/17   Comer, Belia Hemanobert W, MD  HYDROcodone-acetaminophen (NORCO/VICODIN) 5-325 MG tablet Take 1-2 tablets by mouth every 6 hours as needed for pain and/or cough. Patient not taking: Reported on 02/15/2017 05/14/16   Elpidio AnisUpstill, Shari, PA-C  naproxen (NAPROSYN) 500 MG tablet Take 1 tablet (500 mg total) by mouth 2 (two) times daily. Patient not taking: Reported on 05/14/2016 09/16/15   Sam, Ace GinsSerena Y, PA-C  ondansetron (ZOFRAN) 4 MG tablet Take 1 tablet (4 mg total) by mouth every 6 (six) hours. Patient not taking: Reported on 05/14/2016 05/05/16   Pisciotta, Joni ReiningNicole, PA-C    No Known Allergies  Social History  Substance Use Topics  . Smoking status: Current Every Day Smoker    Types: Cigarettes  . Smokeless tobacco: Never Used  . Alcohol use Yes     Comment: occ  regular marijuana use  FMH: mother and MGM with hypertension, no known renal disease  Review of Systems Constitutional: negative for fatigue and malaise Gastrointestinal: negative for nausea, vomiting and  diarrhea Integument/breast: negative for rash All other systems reviewed and are negative    CONSTITUTIONAL:in no apparent distress and alert  Vitals:   02/15/17 1337  BP: 123/73  Pulse: 93  Temp: 98.3 F (36.8 C)   EYES: anicteric HENT: no thrush CARD:Cor RRR RESP:CTA B; normal respiratory effort NW:GNFAOGI:Bowel sounds are normal, liver is not enlarged, spleen is not enlarged MS:no pedal edema noted SKIN:no rashes NEURO: non focal  No results found for: HIV1RNAQUANT No components found for: HIV1GENOTYPRPLUS No components found for: THELPERCELL  Assessment: new patient here with HIV.  Discussed with patient treatment options and side effects, benefits of treatment, long term outcomes.  I discussed the severity of untreated HIV including higher cancer risk, opportunistic infections, renal failure.  Also discussed needing to use condoms, partner disclosure, necessary vaccines, blood monitoring.  Vaccine counseling given. All questions answered.    Plan: 1) labs today 2) start Biktarvy 3) labs in 4 weeks 4) follow up with me in 5 weeks 5) flu shot, Menveo today Pneumovax next visit

## 2017-02-16 LAB — LIPID PANEL
CHOL/HDL RATIO: 4.3 ratio (ref <5.0)
Cholesterol: 179 mg/dL (ref <200)
HDL: 42 mg/dL (ref >40)
LDL CALC: 108 mg/dL — AB (ref <100)
Triglycerides: 147 mg/dL (ref <150)
VLDL: 29 mg/dL (ref <30)

## 2017-02-16 LAB — COMPLETE METABOLIC PANEL WITH GFR
ALBUMIN: 3.6 g/dL (ref 3.6–5.1)
ALT: 23 U/L (ref 9–46)
AST: 33 U/L (ref 10–40)
Alkaline Phosphatase: 65 U/L (ref 40–115)
BILIRUBIN TOTAL: 0.6 mg/dL (ref 0.2–1.2)
BUN: 13 mg/dL (ref 7–25)
CHLORIDE: 103 mmol/L (ref 98–110)
CO2: 25 mmol/L (ref 20–32)
Calcium: 8.8 mg/dL (ref 8.6–10.3)
Creat: 0.92 mg/dL (ref 0.60–1.35)
GFR, Est African American: >89 mL/min (ref >=60)
GLUCOSE: 79 mg/dL (ref 65–99)
Potassium: 4 mmol/L (ref 3.5–5.3)
Sodium: 138 mmol/L (ref 135–146)
Total Protein: 7.5 g/dL (ref 6.1–8.1)

## 2017-02-16 LAB — QUANTIFERON TB GOLD ASSAY (BLOOD)
INTERFERON GAMMA RELEASE ASSAY: NEGATIVE
MITOGEN-NIL SO: 9.55 [IU]/mL
Quantiferon Nil Value: 0.28 IU/mL

## 2017-02-16 LAB — HEPATITIS B CORE ANTIBODY, TOTAL: Hep B Core Total Ab: NONREACTIVE

## 2017-02-16 LAB — PROTIME-INR
INR: 1
Prothrombin Time: 10.4 s (ref 9.0–11.5)

## 2017-02-16 LAB — HEPATITIS B SURFACE ANTIBODY,QUALITATIVE: Hep B S Ab: REACTIVE — AB

## 2017-02-16 LAB — T-HELPER CELL (CD4) - (RCID CLINIC ONLY)
CD4 T CELL HELPER: 13 % — AB (ref 33–55)
CD4 T Cell Abs: 470 /uL (ref 400–2700)

## 2017-02-16 LAB — HEPATITIS A ANTIBODY, TOTAL: Hep A Total Ab: REACTIVE — AB

## 2017-02-16 LAB — HEPATITIS C ANTIBODY: HCV Ab: NONREACTIVE

## 2017-02-16 LAB — HEPATITIS B SURFACE ANTIGEN: Hepatitis B Surface Ag: NONREACTIVE

## 2017-02-16 LAB — RPR

## 2017-02-17 LAB — HIV-1 RNA,QN PCR W/REFLEX GENOTYPE
HIV-1 RNA, QN PCR: 128000 Copies/mL — ABNORMAL HIGH
HIV-1 RNA, QN PCR: 5.11 Log cps/mL — ABNORMAL HIGH

## 2017-02-17 LAB — URINE CYTOLOGY ANCILLARY ONLY
CHLAMYDIA, DNA PROBE: NEGATIVE
Neisseria Gonorrhea: NEGATIVE

## 2017-02-18 ENCOUNTER — Telehealth: Payer: Self-pay | Admitting: *Deleted

## 2017-02-18 NOTE — Telephone Encounter (Signed)
-----   Message from Gardiner Barefootobert W Comer, MD sent at 02/17/2017  9:45 AM EDT ----- Please let him know everything looks good on his labs and to go ahead and start the Biktarvy if he hasn't already. thanks

## 2017-02-18 NOTE — Telephone Encounter (Signed)
Left patient a voice mail stating his labs were good and to start his medication. Advised to call back with any questions. Wendall MolaJacqueline Cockerham

## 2017-02-21 LAB — HLA B*5701: HLA-B 5701 W/RFLX HLA-B HIGH: NEGATIVE

## 2017-03-16 LAB — HIV-1 GENOTYPE: HIV-1 GENOTYPE: DETECTED — AB

## 2017-03-21 ENCOUNTER — Telehealth: Payer: Self-pay | Admitting: *Deleted

## 2017-03-21 NOTE — Telephone Encounter (Signed)
Biktarvy approved through 03/21/18 and patient notified. Reference # [id number]

## 2017-03-21 NOTE — Telephone Encounter (Signed)
Call from patient stating his pharmacy told him there was an issue with getting his Susanne Borders with his insurance. Called [employer] at [phone] and was told this medication needed a cost over ride. ID # for pharmacy benefits is [id number]. Over ride initiated and was told they will have an answer in 24 hrs (maked as urgent) and will notify this office of decision.

## 2017-03-29 ENCOUNTER — Encounter: Payer: Self-pay | Admitting: Internal Medicine

## 2017-03-29 ENCOUNTER — Ambulatory Visit (INDEPENDENT_AMBULATORY_CARE_PROVIDER_SITE_OTHER): Payer: BLUE CROSS/BLUE SHIELD | Admitting: Internal Medicine

## 2017-03-29 VITALS — BP 104/69 | HR 109 | Temp 98.4°F | Wt 180.0 lb

## 2017-03-29 DIAGNOSIS — B20 Human immunodeficiency virus [HIV] disease: Secondary | ICD-10-CM

## 2017-03-29 DIAGNOSIS — Z5181 Encounter for therapeutic drug level monitoring: Secondary | ICD-10-CM | POA: Diagnosis not present

## 2017-03-29 NOTE — Progress Notes (Signed)
   Subjective:    Patient ID: Ethon Wymer, male    DOB: 1981/01/01, 36 y.o.   MRN: [id number]  HPI Here for follow up of HIV I saw him as a new patient last month and since he has started [employer].  He is tolerating well with some nausea associated with it if he takes it on an empty stomach.  He did miss 3 days once.  No rash.  No weight loss or diarrhea.    Review of Systems  Constitutional: Negative for fatigue.  Gastrointestinal: Negative for diarrhea.  Skin: Negative for rash.       Objective:   Physical Exam  Constitutional: He appears well-developed and well-nourished. No distress.  HENT:  Mouth/Throat: No oropharyngeal exudate.  Eyes: No scleral icterus.  Cardiovascular: Normal rate, regular rhythm and normal heart sounds.   No murmur heard. Pulmonary/Chest: Effort normal and breath sounds normal. No respiratory distress.  Skin: No rash noted.          Assessment & Plan:

## 2017-03-29 NOTE — Assessment & Plan Note (Signed)
Doing well.  Labs today to confirm he is becoming suppressed.  rtc 3 months with lab prior

## 2017-03-29 NOTE — Assessment & Plan Note (Signed)
I will check LFTs, creat today on new regimen.

## 2017-03-30 LAB — COMPLETE METABOLIC PANEL WITH GFR
AG Ratio: 1 (calc) (ref 1.0–2.5)
ALT: 17 U/L (ref 9–46)
AST: 23 U/L (ref 10–40)
Albumin: 3.4 g/dL — ABNORMAL LOW (ref 3.6–5.1)
Alkaline phosphatase (APISO): 52 U/L (ref 40–115)
BUN: 12 mg/dL (ref 7–25)
CALCIUM: 8.7 mg/dL (ref 8.6–10.3)
CO2: 26 mmol/L (ref 20–32)
Chloride: 106 mmol/L (ref 98–110)
Creat: 1.23 mg/dL (ref 0.60–1.35)
GFR, EST AFRICAN AMERICAN: 88 mL/min/{1.73_m2} (ref >=60)
GFR, EST NON AFRICAN AMERICAN: 76 mL/min/{1.73_m2} (ref >=60)
GLUCOSE: 93 mg/dL (ref 65–99)
Globulin: 3.4 g/dL (calc) (ref 1.9–3.7)
Potassium: 4.4 mmol/L (ref 3.5–5.3)
Sodium: 138 mmol/L (ref 135–146)
TOTAL PROTEIN: 6.8 g/dL (ref 6.1–8.1)
Total Bilirubin: 0.7 mg/dL (ref 0.2–1.2)

## 2017-03-30 LAB — T-HELPER CELL (CD4) - (RCID CLINIC ONLY)
CD4 T CELL HELPER: 21 % — AB (ref 33–55)
CD4 T Cell Abs: 820 /uL (ref 400–2700)

## 2017-04-01 LAB — HIV-1 RNA QUANT-NO REFLEX-BLD
HIV 1 RNA Quant: 79 copies/mL — ABNORMAL HIGH
HIV-1 RNA QUANT, LOG: 1.9 {Log_copies}/mL — AB

## 2017-04-15 ENCOUNTER — Encounter: Payer: Self-pay | Admitting: Licensed Clinical Social Worker

## 2017-06-21 ENCOUNTER — Ambulatory Visit: Payer: BLUE CROSS/BLUE SHIELD | Admitting: Internal Medicine

## 2017-06-28 ENCOUNTER — Encounter: Payer: Self-pay | Admitting: Internal Medicine

## 2017-06-28 ENCOUNTER — Ambulatory Visit (INDEPENDENT_AMBULATORY_CARE_PROVIDER_SITE_OTHER): Payer: BLUE CROSS/BLUE SHIELD | Admitting: Internal Medicine

## 2017-06-28 ENCOUNTER — Other Ambulatory Visit: Payer: Self-pay

## 2017-06-28 VITALS — BP 133/74 | HR 79 | Temp 98.3°F | Ht 72.0 in | Wt 188.0 lb

## 2017-06-28 DIAGNOSIS — B20 Human immunodeficiency virus [HIV] disease: Secondary | ICD-10-CM | POA: Diagnosis not present

## 2017-06-28 DIAGNOSIS — Z7189 Other specified counseling: Secondary | ICD-10-CM

## 2017-06-28 DIAGNOSIS — Z7185 Encounter for immunization safety counseling: Secondary | ICD-10-CM

## 2017-06-28 DIAGNOSIS — Z23 Encounter for immunization: Secondary | ICD-10-CM

## 2017-06-28 NOTE — Assessment & Plan Note (Signed)
Counseled on pneumovax and given today 

## 2017-06-28 NOTE — Progress Notes (Signed)
   Subjective:    Patient ID: Duane FitchDavid Watson, male    DOB: 12/11/1980, 37 y.o.   MRN: [id number]  HPI Here for follow up of HIV Started on Biktarvy last year and doing well.  No missed doses.  Last CD4 820, viral load was down to 79 copies.  No new issues.     Review of Systems  Gastrointestinal: Negative for diarrhea.  Skin: Negative for rash.  Neurological: Negative for dizziness.       Objective:   Physical Exam  Constitutional: He appears well-developed and well-nourished. No distress.  HENT:  Mouth/Throat: No oropharyngeal exudate.  Eyes: No scleral icterus.  Cardiovascular: Normal rate, regular rhythm and normal heart sounds.  No murmur heard. Pulmonary/Chest: Effort normal and breath sounds normal. No respiratory distress.  Lymphadenopathy:    He has no cervical adenopathy.  Skin: No rash noted.          Assessment & Plan:

## 2017-06-28 NOTE — Assessment & Plan Note (Signed)
Doing well and no issues. Labs today and rtc 4 months with labs 2 weeks prior to visit.

## 2017-06-29 LAB — T-HELPER CELL (CD4) - (RCID CLINIC ONLY)
CD4 % Helper T Cell: 28 % — ABNORMAL LOW (ref 33–55)
CD4 T CELL ABS: 750 /uL (ref 400–2700)

## 2017-06-30 LAB — HIV-1 RNA QUANT-NO REFLEX-BLD
HIV 1 RNA QUANT: DETECTED {copies}/mL — AB
HIV-1 RNA Quant, Log: <1.3 Log copies/mL — AB

## 2017-10-12 ENCOUNTER — Other Ambulatory Visit: Payer: BLUE CROSS/BLUE SHIELD

## 2017-10-26 ENCOUNTER — Ambulatory Visit: Payer: BLUE CROSS/BLUE SHIELD | Admitting: Internal Medicine

## 2018-02-08 ENCOUNTER — Other Ambulatory Visit: Payer: BLUE CROSS/BLUE SHIELD

## 2018-02-09 ENCOUNTER — Other Ambulatory Visit: Payer: BLUE CROSS/BLUE SHIELD

## 2018-02-09 DIAGNOSIS — B20 Human immunodeficiency virus [HIV] disease: Secondary | ICD-10-CM

## 2018-02-10 LAB — T-HELPER CELL (CD4) - (RCID CLINIC ONLY)
CD4 % Helper T Cell: 31 % — ABNORMAL LOW (ref 33–55)
CD4 T Cell Abs: 830 /uL (ref 400–2700)

## 2018-02-11 LAB — HIV-1 RNA QUANT-NO REFLEX-BLD
HIV 1 RNA QUANT: NOT DETECTED {copies}/mL
HIV-1 RNA QUANT, LOG: NOT DETECTED {Log_copies}/mL

## 2018-02-22 ENCOUNTER — Encounter: Payer: Self-pay | Admitting: Internal Medicine

## 2018-02-22 ENCOUNTER — Ambulatory Visit (INDEPENDENT_AMBULATORY_CARE_PROVIDER_SITE_OTHER): Payer: BLUE CROSS/BLUE SHIELD | Admitting: Internal Medicine

## 2018-02-22 ENCOUNTER — Other Ambulatory Visit: Payer: Self-pay | Admitting: Internal Medicine

## 2018-02-22 VITALS — BP 117/83 | HR 66 | Temp 97.8°F | Ht 72.0 in | Wt 192.0 lb

## 2018-02-22 DIAGNOSIS — Z23 Encounter for immunization: Secondary | ICD-10-CM

## 2018-02-22 DIAGNOSIS — Z113 Encounter for screening for infections with a predominantly sexual mode of transmission: Secondary | ICD-10-CM

## 2018-02-22 DIAGNOSIS — Z7185 Encounter for immunization safety counseling: Secondary | ICD-10-CM

## 2018-02-22 DIAGNOSIS — Z0000 Encounter for general adult medical examination without abnormal findings: Secondary | ICD-10-CM | POA: Insufficient documentation

## 2018-02-22 DIAGNOSIS — Z79899 Other long term (current) drug therapy: Secondary | ICD-10-CM | POA: Diagnosis not present

## 2018-02-22 DIAGNOSIS — B20 Human immunodeficiency virus [HIV] disease: Secondary | ICD-10-CM | POA: Diagnosis not present

## 2018-02-22 DIAGNOSIS — Z720 Tobacco use: Secondary | ICD-10-CM | POA: Insufficient documentation

## 2018-02-22 DIAGNOSIS — Z7189 Other specified counseling: Secondary | ICD-10-CM

## 2018-02-22 NOTE — Progress Notes (Signed)
Influenza and Menveo#2 administered during office visit today. Patient tolerated well.  Kazuma Elena S. LPN

## 2018-02-22 NOTE — Addendum Note (Signed)
Addended by: Valarie Cones on: 02/22/2018 04:20 PM   Modules accepted: Orders

## 2018-02-22 NOTE — Assessment & Plan Note (Signed)
I discussed the importance of cessation and he is cutting down.  Encouraged continued efforts

## 2018-02-22 NOTE — Assessment & Plan Note (Signed)
Discussed menveo and flu shot and given today

## 2018-02-22 NOTE — Progress Notes (Signed)
   Subjective:    Patient ID: Duane Watson, male    DOB: 08/06/1980, 37 y.o.   MRN: [id number]  HPI Here for follow up of HIV He was diagnosed in 2018 and started on Biktarvy about 1 year ago.  No issues with the medication and taking daily.  Feels well.  No associated n/v/d.  He is considering moving to [place] in January.     Review of Systems  Constitutional: Negative for fatigue and fever.  Gastrointestinal: Negative for diarrhea and nausea.  Skin: Negative for rash.  Neurological: Negative for dizziness.       Objective:   Physical Exam  Constitutional: He appears well-developed and well-nourished. No distress.  HENT:  Mouth/Throat: No oropharyngeal exudate.  Eyes: No scleral icterus.  Cardiovascular: Normal rate, regular rhythm and normal heart sounds.  No murmur heard. Pulmonary/Chest: Effort normal and breath sounds normal. No respiratory distress.  Skin: No rash noted.   SH: + tobacco       Assessment & Plan:

## 2018-02-22 NOTE — Assessment & Plan Note (Signed)
Doing great. No issues and rtc 3 months in case he is moving after.

## 2018-05-10 ENCOUNTER — Other Ambulatory Visit: Payer: BLUE CROSS/BLUE SHIELD

## 2018-05-24 ENCOUNTER — Encounter: Payer: BLUE CROSS/BLUE SHIELD | Admitting: Internal Medicine

## 2018-06-28 ENCOUNTER — Other Ambulatory Visit: Payer: BLUE CROSS/BLUE SHIELD

## 2018-07-19 ENCOUNTER — Encounter: Payer: BLUE CROSS/BLUE SHIELD | Admitting: Internal Medicine

## 2018-07-21 ENCOUNTER — Other Ambulatory Visit: Payer: BLUE CROSS/BLUE SHIELD

## 2018-07-21 DIAGNOSIS — Z113 Encounter for screening for infections with a predominantly sexual mode of transmission: Secondary | ICD-10-CM

## 2018-07-21 DIAGNOSIS — B20 Human immunodeficiency virus [HIV] disease: Secondary | ICD-10-CM

## 2018-07-21 DIAGNOSIS — Z79899 Other long term (current) drug therapy: Secondary | ICD-10-CM

## 2018-07-21 LAB — T-HELPER CELL (CD4) - (RCID CLINIC ONLY)
CD4 T CELL ABS: 750 /uL (ref 400–2700)
CD4 T CELL HELPER: 28 % — AB (ref 33–55)

## 2018-07-25 LAB — COMPLETE METABOLIC PANEL WITH GFR
AG Ratio: 1.2 (calc) (ref 1.0–2.5)
ALT: 15 U/L (ref 9–46)
AST: 24 U/L (ref 10–40)
Albumin: 3.7 g/dL (ref 3.6–5.1)
Alkaline phosphatase (APISO): 56 U/L (ref 36–130)
BUN: 15 mg/dL (ref 7–25)
CHLORIDE: 107 mmol/L (ref 98–110)
CO2: 26 mmol/L (ref 20–32)
Calcium: 9.1 mg/dL (ref 8.6–10.3)
Creat: 1.11 mg/dL (ref 0.60–1.35)
GFR, EST AFRICAN AMERICAN: 98 mL/min/{1.73_m2} (ref >=60)
GFR, Est Non African American: 85 mL/min/{1.73_m2} (ref >=60)
GLUCOSE: 91 mg/dL (ref 65–99)
Globulin: 3.2 g/dL (calc) (ref 1.9–3.7)
POTASSIUM: 4.5 mmol/L (ref 3.5–5.3)
Sodium: 140 mmol/L (ref 135–146)
Total Bilirubin: 0.8 mg/dL (ref 0.2–1.2)
Total Protein: 6.9 g/dL (ref 6.1–8.1)

## 2018-07-25 LAB — LIPID PANEL
Cholesterol: 219 mg/dL — ABNORMAL HIGH (ref <200)
HDL: 51 mg/dL (ref >=40)
LDL Cholesterol (Calc): 150 mg/dL (calc) — ABNORMAL HIGH
NON-HDL CHOLESTEROL (CALC): 168 mg/dL — AB (ref <130)
Total CHOL/HDL Ratio: 4.3 (calc) (ref <5.0)
Triglycerides: 75 mg/dL (ref <150)

## 2018-07-25 LAB — CBC WITH DIFFERENTIAL/PLATELET
Absolute Monocytes: 302 cells/uL (ref 200–950)
BASOS PCT: 0.4 %
Basophils Absolute: 22 cells/uL (ref 0–200)
EOS PCT: 1.5 %
Eosinophils Absolute: 81 cells/uL (ref 15–500)
HCT: 41.6 % (ref 38.5–50.0)
Hemoglobin: 14.4 g/dL (ref 13.2–17.1)
Lymphs Abs: 2781 cells/uL (ref 850–3900)
MCH: 32.8 pg (ref 27.0–33.0)
MCHC: 34.6 g/dL (ref 32.0–36.0)
MCV: 94.8 fL (ref 80.0–100.0)
MONOS PCT: 5.6 %
MPV: 10.5 fL (ref 7.5–12.5)
NEUTROS PCT: 41 %
Neutro Abs: 2214 cells/uL (ref 1500–7800)
PLATELETS: 330 10*3/uL (ref 140–400)
RBC: 4.39 10*6/uL (ref 4.20–5.80)
RDW: 12.8 % (ref 11.0–15.0)
TOTAL LYMPHOCYTE: 51.5 %
WBC: 5.4 10*3/uL (ref 3.8–10.8)

## 2018-07-25 LAB — HIV-1 RNA QUANT-NO REFLEX-BLD
HIV 1 RNA QUANT: NOT DETECTED {copies}/mL
HIV-1 RNA Quant, Log: <1.3 Log copies/mL

## 2018-07-25 LAB — RPR: RPR Ser Ql: NONREACTIVE

## 2018-07-31 ENCOUNTER — Other Ambulatory Visit (HOSPITAL_COMMUNITY)
Admission: RE | Admit: 2018-07-31 | Discharge: 2018-07-31 | Disposition: A | Discharge status: Home / Self Care | Payer: BLUE CROSS/BLUE SHIELD | Source: Ambulatory Visit | Attending: Internal Medicine | Admitting: Internal Medicine

## 2018-07-31 ENCOUNTER — Encounter: Payer: Self-pay | Admitting: Internal Medicine

## 2018-07-31 ENCOUNTER — Ambulatory Visit (INDEPENDENT_AMBULATORY_CARE_PROVIDER_SITE_OTHER): Payer: BLUE CROSS/BLUE SHIELD | Admitting: Internal Medicine

## 2018-07-31 VITALS — BP 110/69 | HR 64 | Temp 98.1°F | Ht 72.0 in | Wt 198.5 lb

## 2018-07-31 DIAGNOSIS — Z113 Encounter for screening for infections with a predominantly sexual mode of transmission: Secondary | ICD-10-CM | POA: Diagnosis not present

## 2018-07-31 DIAGNOSIS — Z79899 Other long term (current) drug therapy: Secondary | ICD-10-CM

## 2018-07-31 DIAGNOSIS — Z5181 Encounter for therapeutic drug level monitoring: Secondary | ICD-10-CM | POA: Diagnosis not present

## 2018-07-31 DIAGNOSIS — B20 Human immunodeficiency virus [HIV] disease: Secondary | ICD-10-CM | POA: Diagnosis not present

## 2018-07-31 DIAGNOSIS — Z23 Encounter for immunization: Secondary | ICD-10-CM | POA: Diagnosis not present

## 2018-07-31 NOTE — Assessment & Plan Note (Signed)
Lipids reviewed and no significant issues.

## 2018-07-31 NOTE — Assessment & Plan Note (Signed)
Discussed Prevnar and given today 

## 2018-07-31 NOTE — Progress Notes (Signed)
   Subjective:    Patient ID: Duane Watson, male    DOB: [birth year], 38 y.o.   MRN: [id number]  HPI Here for follow up of HIV He was diagnosed in 2018 and started on Biktarvy which he continues.  No issues with the medication and taking daily.  Feels well.  No associated n/v/d.   CD4 750, viral load < 20. Creat, LFTs wnl.    Review of Systems  Constitutional: Negative for fatigue and fever.  Gastrointestinal: Negative for diarrhea and nausea.  Skin: Negative for rash.  Neurological: Negative for dizziness.       Objective:   Physical Exam Constitutional:      General: He is not in acute distress.    Appearance: He is well-developed.  HENT:     Mouth/Throat:     Pharynx: No oropharyngeal exudate.  Eyes:     General: No scleral icterus. Cardiovascular:     Rate and Rhythm: Normal rate and regular rhythm.     Heart sounds: Normal heart sounds. No murmur.  Pulmonary:     Effort: Pulmonary effort is normal. No respiratory distress.     Breath sounds: Normal breath sounds.  Skin:    Findings: No rash.    SH: + tobacco       Assessment & Plan:

## 2018-07-31 NOTE — Assessment & Plan Note (Signed)
Swabs today

## 2018-07-31 NOTE — Addendum Note (Signed)
Addended by: Gildardo Griffes on: 07/31/2018 05:34 PM   Modules accepted: Orders

## 2018-07-31 NOTE — Assessment & Plan Note (Signed)
Doing well, continue Biktarvy and rtc 6 months

## 2018-07-31 NOTE — Assessment & Plan Note (Signed)
Creat, LFTs ok 

## 2018-07-31 NOTE — Addendum Note (Signed)
Addended by: Mariea Clonts D on: 07/31/2018 05:14 PM   Modules accepted: Orders

## 2018-08-02 LAB — CYTOLOGY, (ORAL, ANAL, URETHRAL) ANCILLARY ONLY
Chlamydia: NEGATIVE
Neisseria Gonorrhea: NEGATIVE

## 2018-08-07 ENCOUNTER — Encounter: Payer: BLUE CROSS/BLUE SHIELD | Admitting: Internal Medicine

## 2019-01-17 ENCOUNTER — Other Ambulatory Visit (HOSPITAL_COMMUNITY)
Admission: RE | Admit: 2019-01-17 | Discharge: 2019-01-17 | Disposition: A | Discharge status: Home / Self Care | Payer: BC Managed Care – PPO | Source: Ambulatory Visit | Attending: Internal Medicine | Admitting: Internal Medicine

## 2019-01-17 ENCOUNTER — Other Ambulatory Visit: Payer: BC Managed Care – PPO

## 2019-01-17 ENCOUNTER — Other Ambulatory Visit: Payer: Self-pay

## 2019-01-17 DIAGNOSIS — Z113 Encounter for screening for infections with a predominantly sexual mode of transmission: Secondary | ICD-10-CM | POA: Diagnosis present

## 2019-01-17 DIAGNOSIS — B20 Human immunodeficiency virus [HIV] disease: Secondary | ICD-10-CM | POA: Diagnosis present

## 2019-01-18 LAB — T-HELPER CELL (CD4) - (RCID CLINIC ONLY)
CD4 % Helper T Cell: 39 % (ref 33–65)
CD4 T Cell Abs: 853 /uL (ref 400–1790)

## 2019-01-19 LAB — URINE CYTOLOGY ANCILLARY ONLY
Chlamydia: NEGATIVE
Neisseria Gonorrhea: NEGATIVE

## 2019-01-23 LAB — HIV-1 RNA QUANT-NO REFLEX-BLD
HIV 1 RNA Quant: 25 copies/mL — ABNORMAL HIGH
HIV-1 RNA Quant, Log: 1.4 Log copies/mL — ABNORMAL HIGH

## 2019-01-31 ENCOUNTER — Encounter: Payer: BLUE CROSS/BLUE SHIELD | Admitting: Internal Medicine

## 2019-02-05 ENCOUNTER — Ambulatory Visit (INDEPENDENT_AMBULATORY_CARE_PROVIDER_SITE_OTHER): Payer: BC Managed Care – PPO | Admitting: Internal Medicine

## 2019-02-05 ENCOUNTER — Other Ambulatory Visit: Payer: Self-pay

## 2019-02-05 ENCOUNTER — Encounter: Payer: Self-pay | Admitting: Internal Medicine

## 2019-02-05 VITALS — BP 132/82 | HR 65

## 2019-02-05 DIAGNOSIS — Z79899 Other long term (current) drug therapy: Secondary | ICD-10-CM

## 2019-02-05 DIAGNOSIS — Z720 Tobacco use: Secondary | ICD-10-CM

## 2019-02-05 DIAGNOSIS — B20 Human immunodeficiency virus [HIV] disease: Secondary | ICD-10-CM | POA: Diagnosis not present

## 2019-02-05 DIAGNOSIS — Z113 Encounter for screening for infections with a predominantly sexual mode of transmission: Secondary | ICD-10-CM

## 2019-02-06 NOTE — Progress Notes (Signed)
   Subjective:    Patient ID: Duane Watson, male    DOB: 1980/10/29, 38 y.o.   MRN: [id number]  HPI Here for fllow up of HIV Doing well with Biktarvy and no issues.  CD4 of 853, viral load just 25.  No missed doses, no associated n/v/d.     Review of Systems  Constitutional: Negative for fatigue and fever.  Gastrointestinal: Negative for diarrhea and nausea.  Skin: Negative for rash.       Objective:   Physical Exam Constitutional:      General: He is not in acute distress.    Appearance: He is well-developed.  Eyes:     General: No scleral icterus. Cardiovascular:     Rate and Rhythm: Normal rate and regular rhythm.     Heart sounds: Normal heart sounds. No murmur.  Pulmonary:     Effort: Pulmonary effort is normal.  Skin:    Findings: No rash.  Psychiatric:        Mood and Affect: Mood normal.    SH: + tobacco       Assessment & Plan:

## 2019-02-06 NOTE — Assessment & Plan Note (Signed)
Doing well, no changes and rtc 6 months.

## 2019-02-06 NOTE — Assessment & Plan Note (Signed)
Encouraged cessation.

## 2019-02-26 ENCOUNTER — Other Ambulatory Visit: Payer: Self-pay | Admitting: Internal Medicine

## 2019-06-04 ENCOUNTER — Telehealth: Payer: Self-pay | Admitting: Pharmacy Technician

## 2019-06-04 NOTE — Telephone Encounter (Addendum)
[facility] Patient Advocate Encounter  Patient called with concerns he was unable to pick up his medication at [place]. I spoke with [facility] to find out the reasons and they called the [place] coupon card to find out why the hard stop on billing. Insurance is pended inactive until 06/25/2019 and the copay card currently has sufficient funds.  For the interim time period we will apply for Advancing Access for the patient:  [facility] Patient Advocate Encounter   Patient has been approved for [employer] Advancing Access Patient Assistance Program for [facility]  06/04/2019 to 06/03/2020. This assistance will make the patient's copay $0.  I have spoken with the patient and they will pick up in person at [facility].  The billing information is as follows: Member ID: [id number] [place]: [id number] PCN: [id number] Group: [id number]  Patient knows to call the office with questions or concerns.  Bartholomew Crews, CPhT Specialty Pharmacy Patient [facility] for Infectious Disease Phone: [phone] Fax: [phone] 06/04/2019 11:16 AM

## 2019-07-25 ENCOUNTER — Other Ambulatory Visit: Payer: BC Managed Care – PPO

## 2019-07-25 NOTE — Addendum Note (Signed)
Addended by: Mariea Clonts D on: 07/25/2019 09:06 AM   Modules accepted: Orders

## 2019-08-08 ENCOUNTER — Ambulatory Visit (INDEPENDENT_AMBULATORY_CARE_PROVIDER_SITE_OTHER): Payer: Self-pay | Admitting: Internal Medicine

## 2019-08-08 ENCOUNTER — Other Ambulatory Visit: Payer: Self-pay

## 2019-08-08 ENCOUNTER — Encounter: Payer: BC Managed Care – PPO | Admitting: Internal Medicine

## 2019-08-08 DIAGNOSIS — B20 Human immunodeficiency virus [HIV] disease: Secondary | ICD-10-CM

## 2019-08-08 NOTE — Progress Notes (Signed)
He left without being seen

## 2019-09-28 ENCOUNTER — Other Ambulatory Visit: Payer: Self-pay | Admitting: Internal Medicine

## 2019-09-28 DIAGNOSIS — B20 Human immunodeficiency virus [HIV] disease: Secondary | ICD-10-CM

## 2019-10-25 ENCOUNTER — Other Ambulatory Visit: Payer: Self-pay | Admitting: Internal Medicine

## 2019-10-25 DIAGNOSIS — B20 Human immunodeficiency virus [HIV] disease: Secondary | ICD-10-CM

## 2019-11-01 ENCOUNTER — Other Ambulatory Visit: Payer: Self-pay

## 2019-11-01 ENCOUNTER — Encounter: Payer: Self-pay | Admitting: Internal Medicine

## 2019-11-01 ENCOUNTER — Ambulatory Visit (INDEPENDENT_AMBULATORY_CARE_PROVIDER_SITE_OTHER): Payer: Self-pay | Admitting: Internal Medicine

## 2019-11-01 VITALS — BP 119/75 | HR 62 | Wt 192.0 lb

## 2019-11-01 DIAGNOSIS — Z113 Encounter for screening for infections with a predominantly sexual mode of transmission: Secondary | ICD-10-CM

## 2019-11-01 DIAGNOSIS — B20 Human immunodeficiency virus [HIV] disease: Secondary | ICD-10-CM

## 2019-11-01 DIAGNOSIS — Z720 Tobacco use: Secondary | ICD-10-CM

## 2019-11-01 DIAGNOSIS — Z79899 Other long term (current) drug therapy: Secondary | ICD-10-CM

## 2019-11-01 MED ORDER — BIKTARVY 50-200-25 MG PO TABS: 1.0000 | ORAL_TABLET | Freq: Every day | ORAL | 5 refills | Status: DC

## 2019-11-01 NOTE — Assessment & Plan Note (Signed)
Will check a lipid panel. 

## 2019-11-01 NOTE — Progress Notes (Signed)
   Subjective:    Patient ID: Duane Watson, male    DOB: [birth year], 39 y.o.   MRN: [id number]  HPI Here for follow up of HIV He continues on Biktarvy and denies any missed doses.  No new complaints.  Has had some recent issues with getting medication since he did not renew in time.  Otherwise no missed doses.  Did get his COVID-19 vaccine. Not sexually active.    Review of Systems  Constitutional: Negative for fatigue.  Gastrointestinal: Negative for diarrhea and nausea.  Skin: Negative for rash.       Objective:   Physical Exam Constitutional:      Appearance: Normal appearance.  Eyes:     General: No scleral icterus. Pulmonary:     Effort: Pulmonary effort is normal.  Neurological:     General: No focal deficit present.     Mental Status: He is alert.  Psychiatric:        Mood and Affect: Mood normal.   SH: + tobacco       Assessment & Plan:

## 2019-11-01 NOTE — Assessment & Plan Note (Signed)
counseled

## 2019-11-01 NOTE — Assessment & Plan Note (Signed)
Will screen today 

## 2019-11-01 NOTE — Assessment & Plan Note (Signed)
Doing well and now aware to maintain renewal.  Labs today and rtc 6 months unless concerns.

## 2019-11-02 LAB — URINE CYTOLOGY ANCILLARY ONLY
Chlamydia: NEGATIVE
Comment: NEGATIVE
Comment: NORMAL
Neisseria Gonorrhea: NEGATIVE

## 2019-11-02 LAB — T-HELPER CELL (CD4) - (RCID CLINIC ONLY)
CD4 % Helper T Cell: 38 % (ref 33–65)
CD4 T Cell Abs: 840 /uL (ref 400–1790)

## 2019-11-03 LAB — CBC WITH DIFFERENTIAL/PLATELET
Absolute Monocytes: 189 cells/uL — ABNORMAL LOW (ref 200–950)
Basophils Absolute: 9 cells/uL (ref 0–200)
Basophils Relative: 0.2 %
Eosinophils Absolute: 81 cells/uL (ref 15–500)
Eosinophils Relative: 1.8 %
HCT: 43.1 % (ref 38.5–50.0)
Hemoglobin: 14.7 g/dL (ref 13.2–17.1)
Lymphs Abs: 2147 cells/uL (ref 850–3900)
MCH: 33.4 pg — ABNORMAL HIGH (ref 27.0–33.0)
MCHC: 34.1 g/dL (ref 32.0–36.0)
MCV: 98 fL (ref 80.0–100.0)
MPV: 10.6 fL (ref 7.5–12.5)
Monocytes Relative: 4.2 %
Neutro Abs: 2075 cells/uL (ref 1500–7800)
Neutrophils Relative %: 46.1 %
Platelets: 306 10*3/uL (ref 140–400)
RBC: 4.4 10*6/uL (ref 4.20–5.80)
RDW: 11.8 % (ref 11.0–15.0)
Total Lymphocyte: 47.7 %
WBC: 4.5 10*3/uL (ref 3.8–10.8)

## 2019-11-03 LAB — COMPLETE METABOLIC PANEL WITH GFR
AG Ratio: 1.4 (calc) (ref 1.0–2.5)
ALT: 13 U/L (ref 9–46)
AST: 20 U/L (ref 10–40)
Albumin: 3.8 g/dL (ref 3.6–5.1)
Alkaline phosphatase (APISO): 59 U/L (ref 36–130)
BUN: 13 mg/dL (ref 7–25)
CO2: 24 mmol/L (ref 20–32)
Calcium: 8.7 mg/dL (ref 8.6–10.3)
Chloride: 107 mmol/L (ref 98–110)
Creat: 1.14 mg/dL (ref 0.60–1.35)
GFR, Est African American: 95 mL/min/{1.73_m2} (ref >=60)
GFR, Est Non African American: 82 mL/min/{1.73_m2} (ref >=60)
Globulin: 2.8 g/dL (calc) (ref 1.9–3.7)
Glucose, Bld: 98 mg/dL (ref 65–99)
Potassium: 4.4 mmol/L (ref 3.5–5.3)
Sodium: 139 mmol/L (ref 135–146)
Total Bilirubin: 0.5 mg/dL (ref 0.2–1.2)
Total Protein: 6.6 g/dL (ref 6.1–8.1)

## 2019-11-03 LAB — HIV-1 RNA QUANT-NO REFLEX-BLD
HIV 1 RNA Quant: <20 copies/mL
HIV-1 RNA Quant, Log: <1.3 Log copies/mL

## 2019-11-03 LAB — LIPID PANEL
Cholesterol: 221 mg/dL — ABNORMAL HIGH (ref <200)
HDL: 52 mg/dL (ref >=40)
LDL Cholesterol (Calc): 148 mg/dL (calc) — ABNORMAL HIGH
Non-HDL Cholesterol (Calc): 169 mg/dL (calc) — ABNORMAL HIGH (ref <130)
Total CHOL/HDL Ratio: 4.3 (calc) (ref <5.0)
Triglycerides: 101 mg/dL (ref <150)

## 2019-11-03 LAB — RPR: RPR Ser Ql: NONREACTIVE

## 2019-11-25 ENCOUNTER — Other Ambulatory Visit: Payer: Self-pay

## 2019-11-25 ENCOUNTER — Emergency Department (HOSPITAL_COMMUNITY)
Admission: EM | Admit: 2019-11-25 | Discharge: 2019-11-25 | Disposition: A | Discharge status: Home / Self Care | Payer: Self-pay | Attending: Emergency Medicine | Admitting: Emergency Medicine

## 2019-11-25 ENCOUNTER — Encounter (HOSPITAL_COMMUNITY): Payer: Self-pay | Admitting: Emergency Medicine

## 2019-11-25 DIAGNOSIS — H1032 Unspecified acute conjunctivitis, left eye: Secondary | ICD-10-CM | POA: Insufficient documentation

## 2019-11-25 DIAGNOSIS — F17210 Nicotine dependence, cigarettes, uncomplicated: Secondary | ICD-10-CM | POA: Insufficient documentation

## 2019-11-25 MED ORDER — BACITRACIN-POLYMYXIN B 500-10000 UNIT/GM OP OINT: 1.0000 "application " | TOPICAL_OINTMENT | Freq: Two times a day (BID) | OPHTHALMIC | 0 refills | Status: AC

## 2019-11-25 MED ORDER — FLUORESCEIN SODIUM 1 MG OP STRP
1.0000 | ORAL_STRIP | Freq: Once | OPHTHALMIC | Status: AC
  Administered 2019-11-25: 1 via OPHTHALMIC
  Filled 2019-11-25: qty 1

## 2019-11-25 MED ORDER — TETRACAINE HCL 0.5 % OP SOLN
1.0000 [drp] | Freq: Once | OPHTHALMIC | Status: AC
  Administered 2019-11-25: 1 [drp] via OPHTHALMIC
  Filled 2019-11-25: qty 4

## 2019-11-25 NOTE — Discharge Instructions (Addendum)
You were evaluated in the Emergency Department and after careful evaluation, we did not find any emergent condition requiring admission or further testing in the hospital.  Your exam/testing today was overall reassuring.  Please use the ointment provided as directed to help clear up your conjunctivitis.  Please return to the Emergency Department if you experience any worsening of your condition.  We encourage you to follow up with a primary care provider.  Thank you for allowing [place] to be a part of your care.

## 2019-11-25 NOTE — ED Provider Notes (Signed)
[facility] Emergency Department Provider Note MRN:  [id number]  Arrival date & time: 11/25/19     Chief Complaint   Conjunctivitis   History of Present Illness   Duane Watson is a 39 y.o. year-old male with no pertinent past medical history presenting to the ED with chief complaint of eye irritation.  2 weeks of left eye redness and irritation.  Thinks he scratched the eye on accident a few weeks ago.  Denies any other trauma, denies foreign body.  Has tried homeopathic drops that are not helping.  Denies vision change, no other complaints.  Symptoms are mild to moderate, constant, no other exacerbating or alleviating factors.  Review of Systems  A complete 10 system review of systems was obtained and all systems are negative except as noted in the HPI and PMH.   Patient's Health History    Past Medical History:  Diagnosis Date  . Vaccine counseling 02/15/2017    History reviewed. No pertinent surgical history.  No family history on file.  Social History   Socioeconomic History  . Marital status: Single    Spouse name: Not on file  . Number of children: Not on file  . Years of education: Not on file  . Highest education level: Not on file  Occupational History  . Not on file  Tobacco Use  . Smoking status: Current Every Day Smoker    Packs/day: 0.25    Types: Cigarettes  . Smokeless tobacco: Never Used  Vaping Use  . Vaping Use: Never used  Substance and Sexual Activity  . Alcohol use: Yes    Comment: occ  . Drug use: Yes    Frequency: 21.0 times per week    Types: Marijuana  . Sexual activity: Not Currently    Partners: Male    Birth control/protection: Condom    Comment: declined condoms  Other Topics Concern  . Not on file  Social History Narrative  . Not on file   Social Determinants of Health   Financial Resource Strain:   . Difficulty of Paying Living Expenses:   Food Insecurity:   . Worried About Programme researcher, broadcasting/film/video in the  Last Year:   . Barista in the Last Year:   Transportation Needs:   . Freight forwarder (Medical):   Marland Kitchen Lack of Transportation (Non-Medical):   Physical Activity:   . Days of Exercise per Week:   . Minutes of Exercise per Session:   Stress:   . Feeling of Stress :   Social Connections:   . Frequency of Communication with Friends and Family:   . Frequency of Social Gatherings with Friends and Family:   . Attends Religious Services:   . Active Member of Clubs or Organizations:   . Attends Banker Meetings:   Marland Kitchen Marital Status:   Intimate Partner Violence:   . Fear of Current or Ex-Partner:   . Emotionally Abused:   Marland Kitchen Physically Abused:   . Sexually Abused:      Physical Exam   Vitals:   11/25/19 1525  BP: 131/72  Pulse: 72  Resp: 16  Temp: 98.4 F (36.9 C)  SpO2: 96%    CONSTITUTIONAL: Well-appearing, NAD NEURO:  Alert and oriented x 3, no focal deficits EYES:  eyes equal and reactive, normal extraocular movements, conjunctival erythema to the left orbit ENT/NECK:  no LAD, no JVD CARDIO: Regular rate, well-perfused, normal S1 and S2 PULM:  CTAB no wheezing or rhonchi  GI/GU:  normal bowel sounds, non-distended, non-tender MSK/SPINE:  No gross deformities, no edema SKIN:  no rash, atraumatic PSYCH:  Appropriate speech and behavior  *Additional and/or pertinent findings included in MDM below  Diagnostic and Interventional Summary    EKG Interpretation  Date/Time:    Ventricular Rate:    PR Interval:    QRS Duration:   QT Interval:    QTC Calculation:   R Axis:     Text Interpretation:        Labs Reviewed - No data to display  No orders to display    Medications  fluorescein ophthalmic strip 1 strip (1 strip Left Eye Given 11/25/19 1620)  tetracaine (PONTOCAINE) 0.5 % ophthalmic solution 1 drop (1 drop Left Eye Given 11/25/19 1620)     Procedures  /  Critical Care Procedures  ED Course and Medical Decision Making  I have  reviewed the triage vital signs, the nursing notes, and pertinent available records from the EMR.  Listed above are laboratory and imaging tests that I personally ordered, reviewed, and interpreted and then considered in my medical decision making (see below for details).      Suspect viral or bacterial conjunctivitis, Woods lamp with fluorescein reveals no signs of abrasions or ulcers.  Appropriate for discharge on antibiotics.    Barth Kirks. Sedonia Small, [place] [email or web address]  Final Clinical Impressions(s) / ED Diagnoses     ICD-10-CM   1. Acute conjunctivitis of left eye, unspecified acute conjunctivitis type  H10.32     ED Discharge Orders         Ordered    bacitracin-polymyxin b (POLYSPORIN) ophthalmic ointment  Every 12 hours     Discontinue  Reprint     11/25/19 1643           Discharge Instructions Discussed with and Provided to Patient:     Discharge Instructions     You were evaluated in the Emergency Department and after careful evaluation, we did not find any emergent condition requiring admission or further testing in the hospital.  Your exam/testing today was overall reassuring.  Please use the ointment provided as directed to help clear up your conjunctivitis.  Please return to the Emergency Department if you experience any worsening of your condition.  We encourage you to follow up with a primary care provider.  Thank you for allowing [place] to be a part of your care.       Maudie Flakes, MD 11/25/19 [phone]

## 2019-11-25 NOTE — ED Triage Notes (Signed)
C/o L eye redness and itching x 2 weeks.  Using "pink eye medication" without relief.

## 2020-02-05 ENCOUNTER — Other Ambulatory Visit: Payer: Self-pay

## 2020-02-05 ENCOUNTER — Ambulatory Visit: Payer: Self-pay

## 2020-02-07 ENCOUNTER — Other Ambulatory Visit: Payer: Self-pay | Admitting: Internal Medicine

## 2020-02-07 DIAGNOSIS — B20 Human immunodeficiency virus [HIV] disease: Secondary | ICD-10-CM

## 2020-04-16 ENCOUNTER — Other Ambulatory Visit: Payer: Self-pay

## 2020-04-16 DIAGNOSIS — Z79899 Other long term (current) drug therapy: Secondary | ICD-10-CM

## 2020-04-16 DIAGNOSIS — B20 Human immunodeficiency virus [HIV] disease: Secondary | ICD-10-CM

## 2020-04-16 DIAGNOSIS — Z113 Encounter for screening for infections with a predominantly sexual mode of transmission: Secondary | ICD-10-CM

## 2020-04-21 ENCOUNTER — Other Ambulatory Visit: Payer: Self-pay

## 2020-04-21 ENCOUNTER — Other Ambulatory Visit: Payer: BC Managed Care – PPO

## 2020-04-21 DIAGNOSIS — B20 Human immunodeficiency virus [HIV] disease: Secondary | ICD-10-CM

## 2020-04-21 DIAGNOSIS — Z113 Encounter for screening for infections with a predominantly sexual mode of transmission: Secondary | ICD-10-CM | POA: Diagnosis not present

## 2020-04-22 LAB — T-HELPER CELL (CD4) - (RCID CLINIC ONLY)
CD4 % Helper T Cell: 39 % (ref 33–65)
CD4 T Cell Abs: 1114 /uL (ref 400–1790)

## 2020-04-25 LAB — HIV-1 RNA QUANT-NO REFLEX-BLD
HIV 1 RNA Quant: 54 Copies/mL — ABNORMAL HIGH
HIV-1 RNA Quant, Log: 1.73 Log cps/mL — ABNORMAL HIGH

## 2020-04-25 LAB — RPR: RPR Ser Ql: NONREACTIVE

## 2020-04-25 LAB — COMPLETE METABOLIC PANEL WITH GFR
AG Ratio: 1.1 (calc) (ref 1.0–2.5)
ALT: 11 U/L (ref 9–46)
AST: 18 U/L (ref 10–40)
Albumin: 3.6 g/dL (ref 3.6–5.1)
Alkaline phosphatase (APISO): 54 U/L (ref 36–130)
BUN: 12 mg/dL (ref 7–25)
CO2: 27 mmol/L (ref 20–32)
Calcium: 9 mg/dL (ref 8.6–10.3)
Chloride: 104 mmol/L (ref 98–110)
Creat: 1.24 mg/dL (ref 0.60–1.35)
GFR, Est African American: 85 mL/min/{1.73_m2} (ref >=60)
GFR, Est Non African American: 73 mL/min/{1.73_m2} (ref >=60)
Globulin: 3.2 g/dL (calc) (ref 1.9–3.7)
Glucose, Bld: 99 mg/dL (ref 65–99)
Potassium: 3.8 mmol/L (ref 3.5–5.3)
Sodium: 138 mmol/L (ref 135–146)
Total Bilirubin: 0.8 mg/dL (ref 0.2–1.2)
Total Protein: 6.8 g/dL (ref 6.1–8.1)

## 2020-04-25 LAB — CBC WITH DIFFERENTIAL/PLATELET
Absolute Monocytes: 288 cells/uL (ref 200–950)
Basophils Absolute: 19 cells/uL (ref 0–200)
Basophils Relative: 0.3 %
Eosinophils Absolute: 38 cells/uL (ref 15–500)
Eosinophils Relative: 0.6 %
HCT: 41.1 % (ref 38.5–50.0)
Hemoglobin: 14.1 g/dL (ref 13.2–17.1)
Lymphs Abs: 3008 cells/uL (ref 850–3900)
MCH: 32.9 pg (ref 27.0–33.0)
MCHC: 34.3 g/dL (ref 32.0–36.0)
MCV: 95.8 fL (ref 80.0–100.0)
MPV: 10.7 fL (ref 7.5–12.5)
Monocytes Relative: 4.5 %
Neutro Abs: 3046 cells/uL (ref 1500–7800)
Neutrophils Relative %: 47.6 %
Platelets: 308 10*3/uL (ref 140–400)
RBC: 4.29 10*6/uL (ref 4.20–5.80)
RDW: 12.8 % (ref 11.0–15.0)
Total Lymphocyte: 47 %
WBC: 6.4 10*3/uL (ref 3.8–10.8)

## 2020-05-05 ENCOUNTER — Encounter: Payer: Self-pay | Admitting: Internal Medicine

## 2020-05-05 ENCOUNTER — Telehealth: Payer: Self-pay

## 2020-05-05 NOTE — Telephone Encounter (Signed)
[facility] Patient Product/process development scientist completed.    The patient is insured through [employer].   We will continue to follow to see if copay assistance is needed.  Clearance Coots, CPhT [facility] Patient Telecare [facility] Phone: [phone] Fax:  [phone]

## 2020-05-14 ENCOUNTER — Ambulatory Visit (INDEPENDENT_AMBULATORY_CARE_PROVIDER_SITE_OTHER): Payer: BC Managed Care – PPO | Admitting: Internal Medicine

## 2020-05-14 ENCOUNTER — Encounter: Payer: Self-pay | Admitting: Internal Medicine

## 2020-05-14 ENCOUNTER — Other Ambulatory Visit: Payer: Self-pay

## 2020-05-14 VITALS — BP 98/65 | HR 83 | Temp 98.0°F | Resp 16 | Ht 72.0 in | Wt 201.0 lb

## 2020-05-14 DIAGNOSIS — B20 Human immunodeficiency virus [HIV] disease: Secondary | ICD-10-CM | POA: Diagnosis not present

## 2020-05-14 DIAGNOSIS — Z720 Tobacco use: Secondary | ICD-10-CM | POA: Diagnosis not present

## 2020-05-14 DIAGNOSIS — Z113 Encounter for screening for infections with a predominantly sexual mode of transmission: Secondary | ICD-10-CM | POA: Diagnosis not present

## 2020-05-14 DIAGNOSIS — Z5181 Encounter for therapeutic drug level monitoring: Secondary | ICD-10-CM | POA: Diagnosis not present

## 2020-05-14 NOTE — Progress Notes (Signed)
   Subjective:    Patient ID: Duane Watson, male    DOB: April 18, 1981, 39 y.o.   MRN: [id number]  HPI Here for follow up of HIV He continues on Biktarvy with no missed doses. CD4 of 1114 and viral load just 54 copies.  Creat, LFTs wnl.  No concerns today.    Review of Systems  Constitutional: Negative for fatigue.  Gastrointestinal: Negative for diarrhea and nausea.  Skin: Negative for rash.       Objective:   Physical Exam Constitutional:      Appearance: Normal appearance.  Eyes:     General: No scleral icterus. Pulmonary:     Effort: Pulmonary effort is normal.  Neurological:     General: No focal deficit present.     Mental Status: He is alert.  Psychiatric:        Mood and Affect: Mood normal.   SH: + tobacco       Assessment & Plan:

## 2020-05-14 NOTE — Assessment & Plan Note (Addendum)
Screened negative.  No recent sexual activity.  Condoms offered

## 2020-05-14 NOTE — Assessment & Plan Note (Signed)
Creat, LFTs wnl.  

## 2020-05-14 NOTE — Assessment & Plan Note (Signed)
Discussed cessation 

## 2020-05-14 NOTE — Assessment & Plan Note (Signed)
He continues to do well with Biktarvy, no missed doses or concerns.  No changes, rtc in 6 months. Discussed flu shot and he refuses.

## 2020-08-08 ENCOUNTER — Other Ambulatory Visit: Payer: Self-pay | Admitting: Internal Medicine

## 2020-08-08 DIAGNOSIS — B20 Human immunodeficiency virus [HIV] disease: Secondary | ICD-10-CM

## 2020-11-19 ENCOUNTER — Other Ambulatory Visit: Payer: Self-pay

## 2020-11-19 ENCOUNTER — Other Ambulatory Visit: Payer: BC Managed Care – PPO

## 2020-11-19 DIAGNOSIS — B20 Human immunodeficiency virus [HIV] disease: Secondary | ICD-10-CM

## 2020-11-20 LAB — T-HELPER CELL (CD4) - (RCID CLINIC ONLY)
CD4 % Helper T Cell: 39 % (ref 33–65)
CD4 T Cell Abs: 1259 /uL (ref 400–1790)

## 2020-11-21 LAB — HIV-1 RNA QUANT-NO REFLEX-BLD
HIV 1 RNA Quant: <20 Copies/mL — ABNORMAL HIGH
HIV-1 RNA Quant, Log: <1.3 Log cps/mL — ABNORMAL HIGH

## 2020-12-03 ENCOUNTER — Encounter: Payer: Self-pay | Admitting: Internal Medicine

## 2020-12-03 ENCOUNTER — Ambulatory Visit (INDEPENDENT_AMBULATORY_CARE_PROVIDER_SITE_OTHER): Payer: BC Managed Care – PPO | Admitting: Internal Medicine

## 2020-12-03 ENCOUNTER — Other Ambulatory Visit: Payer: Self-pay

## 2020-12-03 VITALS — BP 123/72 | HR 76 | Temp 98.4°F | Wt 205.0 lb

## 2020-12-03 DIAGNOSIS — Z720 Tobacco use: Secondary | ICD-10-CM

## 2020-12-03 DIAGNOSIS — Z113 Encounter for screening for infections with a predominantly sexual mode of transmission: Secondary | ICD-10-CM

## 2020-12-03 DIAGNOSIS — B20 Human immunodeficiency virus [HIV] disease: Secondary | ICD-10-CM

## 2020-12-03 DIAGNOSIS — Z79899 Other long term (current) drug therapy: Secondary | ICD-10-CM

## 2020-12-03 MED ORDER — BIKTARVY 50-200-25 MG PO TABS: 1.0000 | ORAL_TABLET | Freq: Every day | ORAL | 11 refills | Status: DC

## 2020-12-03 NOTE — Progress Notes (Signed)
   Subjective:    Patient ID: Duane Watson, male    DOB: 04/01/1981, 40 y.o.   MRN: [id number]  HPI He is here for follow up of HIV He continues on biktarvy with no missed doses. CD4 1259 and viral load < 20.  He has no issues with getting, taking or tolerating the medication.     Review of Systems  Constitutional:  Negative for fatigue.  Gastrointestinal:  Negative for diarrhea.  Skin:  Negative for rash.      Objective:   Physical Exam Eyes:     General: No scleral icterus. Pulmonary:     Effort: Pulmonary effort is normal.  Skin:    Findings: No rash.  Neurological:     General: No focal deficit present.     Mental Status: He is alert.    SH: + tobacco      Assessment & Plan:

## 2020-12-03 NOTE — Assessment & Plan Note (Signed)
I have advised quitting.

## 2020-12-03 NOTE — Assessment & Plan Note (Signed)
He continues to do well on Biktarvy and no changes indicated.  rtc in 6 months I have recommended a booster COVID-19 vaccine

## 2021-01-28 DIAGNOSIS — K5900 Constipation, unspecified: Secondary | ICD-10-CM | POA: Diagnosis not present

## 2021-10-26 ENCOUNTER — Encounter: Payer: Self-pay | Admitting: Nurse Practitioner

## 2021-10-26 ENCOUNTER — Ambulatory Visit (INDEPENDENT_AMBULATORY_CARE_PROVIDER_SITE_OTHER): Payer: BC Managed Care – PPO | Admitting: Nurse Practitioner

## 2021-10-26 ENCOUNTER — Ambulatory Visit: Payer: BC Managed Care – PPO | Admitting: Nurse Practitioner

## 2021-10-26 VITALS — BP 108/80 | HR 83 | Temp 97.6°F | Resp 14 | Ht 72.0 in | Wt 208.6 lb

## 2021-10-26 DIAGNOSIS — Z720 Tobacco use: Secondary | ICD-10-CM | POA: Diagnosis not present

## 2021-10-26 DIAGNOSIS — Z0000 Encounter for general adult medical examination without abnormal findings: Secondary | ICD-10-CM | POA: Diagnosis not present

## 2021-10-26 DIAGNOSIS — E7800 Pure hypercholesterolemia, unspecified: Secondary | ICD-10-CM

## 2021-10-26 DIAGNOSIS — F1290 Cannabis use, unspecified, uncomplicated: Secondary | ICD-10-CM | POA: Insufficient documentation

## 2021-10-26 DIAGNOSIS — B20 Human immunodeficiency virus [HIV] disease: Secondary | ICD-10-CM

## 2021-10-26 DIAGNOSIS — Z1331 Encounter for screening for depression: Secondary | ICD-10-CM | POA: Insufficient documentation

## 2021-10-26 NOTE — Patient Instructions (Signed)
Nice to see you today ?I will be in touch with the lab results once I have them ?Follow up with me in 1 year, sooner if you need me ? ?

## 2021-10-26 NOTE — Assessment & Plan Note (Signed)
No longer using traditional tobacco products patient currently using e-cigarettes. ?

## 2021-10-26 NOTE — Assessment & Plan Note (Signed)
Discussed age-appropriate immunizations and screening exams. 

## 2021-10-26 NOTE — Assessment & Plan Note (Signed)
Patient had a borderline positive PHQ-9 in office.  Patient denies HI/SI/AVH.  Did encourage patient to try therapy as I do feel like this will be beneficial.  Ambulatory referral made to psychology ?

## 2021-10-26 NOTE — Progress Notes (Signed)
? ?New Patient Office Visit ? ?Subjective   ? ?Patient ID: Duane FitchDavid Watson, male    DOB: 09-27-1980  Age: 41 y.o. MRN: [id number] ? ?CC:  ?Chief Complaint  ?Patient presents with  ? Establish Care  ?  No previous PCP  ? ? ?HPI ?Duane Watson presents to establish care ? ?for complete physical and follow up of chronic conditions. ? ?Immunizations: ?-Tetanus: unsure ?-Influenza: haven't since covid  ?-Covid-19: pfizer x2  ?-Shingles: too young ?-Pneumonia: utd ? ?-HPV: ? ?Diet: Fair diet.  1-2 meals a day. Snacks a lot. Increasing water. 1 soda every other day ?Exercise: No regular exercise. ? ?Eye exam: PRN  ?Dental exam: Completes semi-annually LTR.  ? ? ?Colonoscopy:  start at 46 ?Dexa: Na ?PSA: need at 46 ? ?Lung Cancer Screening: NA currently   ? ?Sleep: 10-11 at night. States takes a while to go to sleep. Will get up 7 am for work. Feels rested half the time. Not a snorer  ? ?HIV: Patient currently mains on Biktarvy medication.  He is followed by Dr. Staci Righter[facility]t [facility] infectious disease. ? ?Outpatient Encounter Medications as of 10/26/2021  ?Medication Sig  ? bictegravir-emtricitabine-tenofovir AF (BIKTARVY) 50-200-25 MG TABS tablet Take 1 tablet by mouth daily.  ? ?No facility-administered encounter medications on file as of 10/26/2021.  ? ? ?Past Medical History:  ?Diagnosis Date  ? HIV (human immunodeficiency virus infection) (HCC)   ? Vaccine counseling 02/15/2017  ? ? ?Past Surgical History:  ?Procedure Laterality Date  ? NO PAST SURGERIES    ? ? ?Family History  ?Problem Relation Age of Onset  ? Hypertension Mother   ? Hypertension Sister   ? Cancer Brother 18  ?     ear cancer  ? Kidney failure Maternal Grandmother   ? ? ?Social History  ? ?Socioeconomic History  ? Marital status: Single  ?  Spouse name: Not on file  ? Number of children: 0  ? Years of education: Not on file  ? Highest education level: High school graduate  ?Occupational History  ? Not on file  ?Tobacco Use  ? Smoking status: Former  ?  Packs/day:  0.25  ?  Years: 19.00  ?  Pack years: 4.75  ?  Types: Cigarettes  ?  Quit date: 01/10/2021  ?  Years since quitting: 0.7  ? Smokeless tobacco: Never  ?Vaping Use  ? Vaping Use: Every day  ? Substances: Nicotine, Flavoring  ?Substance and Sexual Activity  ? Alcohol use: Yes  ?  Comment: socially  ? Drug use: Yes  ?  Frequency: 21.0 times per week  ?  Types: Marijuana  ?  Comment: daily. enjoy it and keeps him calm  ? Sexual activity: Yes  ?  Partners: Male  ?Other Topics Concern  ? Not on file  ?Social History Narrative  ? Fulltime: hospice aid  ?   ? Hobbies: music, tennis, video games  ? ?Social Determinants of Health  ? ?Financial Resource Strain: Not on file  ?Food Insecurity: Not on file  ?Transportation Needs: Not on file  ?Physical Activity: Not on file  ?Stress: Not on file  ?Social Connections: Not on file  ?Intimate Partner Violence: Not on file  ? ? ?Review of Systems  ?Constitutional:  Negative for chills, fever and malaise/fatigue.  ?Respiratory:  Negative for cough and shortness of breath.   ?Cardiovascular:  Negative for chest pain and leg swelling.  ?Gastrointestinal:  Negative for abdominal pain, constipation, diarrhea, nausea and vomiting.  ?  BM daily  ?Genitourinary:  Negative for dysuria and hematuria.  ?     Nocturia started approx 1 year ago 1 time some nights  ?Neurological:  Negative for dizziness, tingling, weakness and headaches.  ?Psychiatric/Behavioral:  Negative for hallucinations and suicidal ideas.   ? ?  ? ? ?Objective   ? ?BP 108/80   Pulse 83   Temp 97.6 ?F (36.4 ?C)   Resp 14   Ht 6' (1.829 m)   Wt 208 lb 9 oz (94.6 kg)   SpO2 98%   BMI 28.29 kg/m?  ? ?Physical Exam ?Vitals and nursing note reviewed. Exam conducted with a chaperone present [facility], RMA).  ?Constitutional:   ?   Appearance: Normal appearance.  ?HENT:  ?   Right Ear: Tympanic membrane, ear canal and external ear normal.  ?   Left Ear: Tympanic membrane, ear canal and external ear normal.  ?    Mouth/Throat:  ?   Mouth: Mucous membranes are moist.  ?   Pharynx: Oropharynx is clear.  ?Eyes:  ?   Extraocular Movements: Extraocular movements intact.  ?   Pupils: Pupils are equal, round, and reactive to light.  ?Cardiovascular:  ?   Rate and Rhythm: Normal rate and regular rhythm.  ?   Pulses: Normal pulses.  ?   Heart sounds: Normal heart sounds.  ?Pulmonary:  ?   Effort: Pulmonary effort is normal.  ?   Breath sounds: Normal breath sounds.  ?Abdominal:  ?   General: Bowel sounds are normal. There is no distension.  ?   Palpations: There is no mass.  ?   Tenderness: There is no abdominal tenderness.  ?   Hernia: No hernia is present.  ?Musculoskeletal:  ?   Right lower leg: No edema.  ?   Left lower leg: No edema.  ?Lymphadenopathy:  ?   Cervical: No cervical adenopathy.  ?Skin: ?   General: Skin is warm.  ?Neurological:  ?   General: No focal deficit present.  ?   Mental Status: He is alert.  ?   Deep Tendon Reflexes:  ?   Reflex Scores: ?     Bicep reflexes are 2+ on the right side and 2+ on the left side. ?     Patellar reflexes are 2+ on the right side and 2+ on the left side. ?   Comments: Bilateral upper and lower extremity strength 5/5  ?Psychiatric:     ?   Mood and Affect: Mood normal.     ?   Behavior: Behavior normal.     ?   Thought Content: Thought content normal.     ?   Judgment: Judgment normal.  ? ? ? ?  ? ?Assessment & Plan:  ? ?Problem List Items Addressed This Visit   ? ?  ? Other  ? Human immunodeficiency virus (HIV) disease (HCC)  ?  Patient currently on Biktarvy.  He is followed by Dr. Staci Righter infectious disease.  Follow-up as recommended per ID and continue taking medication as prescribed ? ?  ?  ? Preventative health care - Primary  ?  Discussed age-appropriate immunizations and screening exams. ? ?  ?  ? Relevant Orders  ? CBC with Differential/Platelet  ? Comprehensive metabolic panel  ? Lipid panel  ? TSH  ? Ambulatory referral to Psychology  ? Current every day nicotine vapor  product user  ?  No longer using traditional tobacco products patient currently using e-cigarettes. ? ?  ?  ?  Marijuana smoker  ?  Encourage patient to stop smoking marijuana.  Did inform patient this can exacerbate mental health disorders and quite the real possibility of getting a laced product that could be harmful to his health.  Patient acknowledged and understood ? ?  ?  ? Elevated LDL cholesterol level  ?  Noticed on previous lab results.  Pending lipid panel today ? ?  ?  ? Relevant Orders  ? Lipid panel  ? Positive depression screening  ?  Patient had a borderline positive PHQ-9 in office.  Patient denies HI/SI/AVH.  Did encourage patient to try therapy as I do feel like this will be beneficial.  Ambulatory referral made to psychology ? ?  ?  ? Relevant Orders  ? Ambulatory referral to Psychology  ? ? ?Return in about 1 year (around 10/27/2022) for CPE and labs.  ? ?Audria Nine, NP ? ? ?

## 2021-10-26 NOTE — Assessment & Plan Note (Signed)
Patient currently on Biktarvy.  He is followed by Dr. Staci Righter infectious disease.  Follow-up as recommended per ID and continue taking medication as prescribed ?

## 2021-10-26 NOTE — Assessment & Plan Note (Signed)
Noticed on previous lab results.  Pending lipid panel today ?

## 2021-10-26 NOTE — Assessment & Plan Note (Signed)
Encourage patient to stop smoking marijuana.  Did inform patient this can exacerbate mental health disorders and quite the real possibility of getting a laced product that could be harmful to his health.  Patient acknowledged and understood ?

## 2021-10-27 LAB — COMPREHENSIVE METABOLIC PANEL
ALT: 17 U/L (ref 0–53)
AST: 21 U/L (ref 0–37)
Albumin: 3.8 g/dL (ref 3.5–5.2)
Alkaline Phosphatase: 55 U/L (ref 39–117)
BUN: 20 mg/dL (ref 6–23)
CO2: 22 mEq/L (ref 19–32)
Calcium: 8.9 mg/dL (ref 8.4–10.5)
Chloride: 106 mEq/L (ref 96–112)
Creatinine, Ser: 1.33 mg/dL (ref 0.40–1.50)
GFR: 67.23 mL/min (ref >60.00)
Glucose, Bld: 90 mg/dL (ref 70–99)
Potassium: 4.1 mEq/L (ref 3.5–5.1)
Sodium: 138 mEq/L (ref 135–145)
Total Bilirubin: 0.8 mg/dL (ref 0.2–1.2)
Total Protein: 7 g/dL (ref 6.0–8.3)

## 2021-10-27 LAB — LIPID PANEL
Cholesterol: 246 mg/dL — ABNORMAL HIGH (ref 0–200)
HDL: 54.5 mg/dL (ref >39.00)
LDL Cholesterol: 169 mg/dL — ABNORMAL HIGH (ref 0–99)
NonHDL: 191.26
Total CHOL/HDL Ratio: 5
Triglycerides: 109 mg/dL (ref 0.0–149.0)
VLDL: 21.8 mg/dL (ref 0.0–40.0)

## 2021-10-27 LAB — CBC WITH DIFFERENTIAL/PLATELET
Basophils Absolute: 0 10*3/uL (ref 0.0–0.1)
Basophils Relative: 0.8 % (ref 0.0–3.0)
Eosinophils Absolute: 0.1 10*3/uL (ref 0.0–0.7)
Eosinophils Relative: 1.2 % (ref 0.0–5.0)
HCT: 39.5 % (ref 39.0–52.0)
Hemoglobin: 13.9 g/dL (ref 13.0–17.0)
Lymphocytes Relative: 50.6 % — ABNORMAL HIGH (ref 12.0–46.0)
Lymphs Abs: 2.4 10*3/uL (ref 0.7–4.0)
MCHC: 35 g/dL (ref 30.0–36.0)
MCV: 96.9 fl (ref 78.0–100.0)
Monocytes Absolute: 0.3 10*3/uL (ref 0.1–1.0)
Monocytes Relative: 6.9 % (ref 3.0–12.0)
Neutro Abs: 1.9 10*3/uL (ref 1.4–7.7)
Neutrophils Relative %: 40.5 % — ABNORMAL LOW (ref 43.0–77.0)
Platelets: 304 10*3/uL (ref 150.0–400.0)
RBC: 4.08 Mil/uL — ABNORMAL LOW (ref 4.22–5.81)
RDW: 13.2 % (ref 11.5–15.5)
WBC: 4.8 10*3/uL (ref 4.0–10.5)

## 2021-10-27 LAB — TSH: TSH: 1.15 u[IU]/mL (ref 0.35–5.50)

## 2021-11-03 ENCOUNTER — Telehealth: Payer: Self-pay

## 2021-11-03 NOTE — Telephone Encounter (Signed)
Left message to call back to go over lab results, this was not viewed on mychart "I have reviewed your labs. Liver, kidney, thyroid, red and white blood cells look ok. Your cholesterol is elevated at 169 and we want that number to be below 100. We recommend that you get 30 mins of exercise 5 times a week and following more of a Mediterranean diet will help with that cholesterol medication. Let me know if you have any questions"

## 2021-11-10 NOTE — Telephone Encounter (Signed)
Letter mailed with results. 

## 2021-12-30 ENCOUNTER — Other Ambulatory Visit: Payer: Self-pay | Admitting: Internal Medicine

## 2021-12-30 DIAGNOSIS — B20 Human immunodeficiency virus [HIV] disease: Secondary | ICD-10-CM

## 2022-01-29 ENCOUNTER — Other Ambulatory Visit (HOSPITAL_COMMUNITY)
Admission: RE | Admit: 2022-01-29 | Discharge: 2022-01-29 | Disposition: A | Discharge status: Home / Self Care | Payer: BC Managed Care – PPO | Source: Ambulatory Visit | Attending: Internal Medicine | Admitting: Internal Medicine

## 2022-01-29 ENCOUNTER — Other Ambulatory Visit: Payer: Self-pay

## 2022-01-29 ENCOUNTER — Encounter: Payer: Self-pay | Admitting: Internal Medicine

## 2022-01-29 ENCOUNTER — Ambulatory Visit (INDEPENDENT_AMBULATORY_CARE_PROVIDER_SITE_OTHER): Payer: BC Managed Care – PPO | Admitting: Internal Medicine

## 2022-01-29 VITALS — BP 138/78 | HR 73 | Temp 98.0°F | Ht 72.0 in | Wt 208.0 lb

## 2022-01-29 DIAGNOSIS — B20 Human immunodeficiency virus [HIV] disease: Secondary | ICD-10-CM | POA: Insufficient documentation

## 2022-01-29 DIAGNOSIS — Z720 Tobacco use: Secondary | ICD-10-CM

## 2022-01-29 DIAGNOSIS — Z113 Encounter for screening for infections with a predominantly sexual mode of transmission: Secondary | ICD-10-CM | POA: Insufficient documentation

## 2022-01-29 DIAGNOSIS — Z5181 Encounter for therapeutic drug level monitoring: Secondary | ICD-10-CM

## 2022-01-29 DIAGNOSIS — E7800 Pure hypercholesterolemia, unspecified: Secondary | ICD-10-CM

## 2022-01-29 NOTE — Assessment & Plan Note (Signed)
Will check his lipid panel 

## 2022-01-29 NOTE — Progress Notes (Signed)
   Subjective:    Patient ID: Duane Watson, male    DOB: [birth year], 41 y.o.   MRN: [id number]  HPI He is here for follow up of HIV He continues on Biktarvy and was last seen over 1 year ago.  Last labs done at that time were good.  He has had no issues since that time and continues taking his medication.   No complaints    Review of Systems  Constitutional:  Negative for fatigue.  Gastrointestinal:  Negative for diarrhea.  Skin:  Negative for rash.       Objective:   Physical Exam Eyes:     General: No scleral icterus. Pulmonary:     Effort: Pulmonary effort is normal.  Skin:    Findings: No rash.  Neurological:     Mental Status: He is alert.   SH: + tobacco        Assessment & Plan:

## 2022-01-29 NOTE — Assessment & Plan Note (Signed)
He is vaping now in replacement of cigarettes and plans to quit vaping by his birthday this month.  Encouraged.

## 2022-01-29 NOTE — Assessment & Plan Note (Signed)
Will monitor his CMP, cbc

## 2022-01-29 NOTE — Assessment & Plan Note (Signed)
Will screen 

## 2022-01-30 ENCOUNTER — Other Ambulatory Visit: Payer: Self-pay | Admitting: Internal Medicine

## 2022-01-30 DIAGNOSIS — B20 Human immunodeficiency virus [HIV] disease: Secondary | ICD-10-CM

## 2022-01-30 MED ORDER — BIKTARVY 50-200-25 MG PO TABS: 1.0000 | ORAL_TABLET | Freq: Every day | ORAL | 11 refills | Status: DC

## 2022-02-01 LAB — T-HELPER CELLS (CD4) COUNT (NOT AT ARMC)
Absolute CD4: 1313 cells/uL (ref 490–1740)
CD4 T Helper %: 41 % (ref 30–61)
Total lymphocyte count: 3224 cells/uL (ref 850–3900)

## 2022-02-01 LAB — CYTOLOGY, (ORAL, ANAL, URETHRAL) ANCILLARY ONLY
Chlamydia: NEGATIVE
Chlamydia: NEGATIVE
Comment: NEGATIVE
Comment: NEGATIVE
Comment: NORMAL
Comment: NORMAL
Neisseria Gonorrhea: NEGATIVE
Neisseria Gonorrhea: NEGATIVE

## 2022-02-01 LAB — COMPLETE METABOLIC PANEL WITH GFR
AG Ratio: 1.2 (calc) (ref 1.0–2.5)
ALT: 14 U/L (ref 9–46)
AST: 18 U/L (ref 10–40)
Albumin: 3.9 g/dL (ref 3.6–5.1)
Alkaline phosphatase (APISO): 58 U/L (ref 36–130)
BUN/Creatinine Ratio: 13 (calc) (ref 6–22)
BUN: 17 mg/dL (ref 7–25)
CO2: 25 mmol/L (ref 20–32)
Calcium: 9.3 mg/dL (ref 8.6–10.3)
Chloride: 107 mmol/L (ref 98–110)
Creat: 1.32 mg/dL — ABNORMAL HIGH (ref 0.60–1.26)
Globulin: 3.3 g/dL (calc) (ref 1.9–3.7)
Glucose, Bld: 86 mg/dL (ref 65–99)
Potassium: 4.3 mmol/L (ref 3.5–5.3)
Sodium: 141 mmol/L (ref 135–146)
Total Bilirubin: 0.7 mg/dL (ref 0.2–1.2)
Total Protein: 7.2 g/dL (ref 6.1–8.1)
eGFR: 70 mL/min/{1.73_m2} (ref >=60)

## 2022-02-01 LAB — CBC WITH DIFFERENTIAL/PLATELET
Absolute Monocytes: 242 cells/uL (ref 200–950)
Basophils Absolute: 18 cells/uL (ref 0–200)
Basophils Relative: 0.3 %
Eosinophils Absolute: 47 cells/uL (ref 15–500)
Eosinophils Relative: 0.8 %
HCT: 43.5 % (ref 38.5–50.0)
Hemoglobin: 15.1 g/dL (ref 13.2–17.1)
Lymphs Abs: 2991 cells/uL (ref 850–3900)
MCH: 33 pg (ref 27.0–33.0)
MCHC: 34.7 g/dL (ref 32.0–36.0)
MCV: 95 fL (ref 80.0–100.0)
MPV: 10.1 fL (ref 7.5–12.5)
Monocytes Relative: 4.1 %
Neutro Abs: 2602 cells/uL (ref 1500–7800)
Neutrophils Relative %: 44.1 %
Platelets: 342 10*3/uL (ref 140–400)
RBC: 4.58 10*6/uL (ref 4.20–5.80)
RDW: 12.7 % (ref 11.0–15.0)
Total Lymphocyte: 50.7 %
WBC: 5.9 10*3/uL (ref 3.8–10.8)

## 2022-02-01 LAB — URINE CYTOLOGY ANCILLARY ONLY
Chlamydia: NEGATIVE
Comment: NEGATIVE
Comment: NORMAL
Neisseria Gonorrhea: NEGATIVE

## 2022-02-01 LAB — LIPID PANEL
Cholesterol: 217 mg/dL — ABNORMAL HIGH (ref <200)
HDL: 48 mg/dL (ref >=40)
LDL Cholesterol (Calc): 138 mg/dL (calc) — ABNORMAL HIGH
Non-HDL Cholesterol (Calc): 169 mg/dL (calc) — ABNORMAL HIGH (ref <130)
Total CHOL/HDL Ratio: 4.5 (calc) (ref <5.0)
Triglycerides: 172 mg/dL — ABNORMAL HIGH (ref <150)

## 2022-02-01 LAB — HIV-1 RNA QUANT-NO REFLEX-BLD
HIV 1 RNA Quant: NOT DETECTED Copies/mL
HIV-1 RNA Quant, Log: NOT DETECTED Log cps/mL

## 2022-02-01 LAB — RPR: RPR Ser Ql: NONREACTIVE

## 2022-07-21 ENCOUNTER — Other Ambulatory Visit: Payer: Self-pay

## 2022-07-21 ENCOUNTER — Other Ambulatory Visit: Payer: BC Managed Care – PPO

## 2022-07-21 DIAGNOSIS — B20 Human immunodeficiency virus [HIV] disease: Secondary | ICD-10-CM

## 2022-07-22 LAB — T-HELPER CELL (CD4) - (RCID CLINIC ONLY)
CD4 % Helper T Cell: 41 % (ref 33–65)
CD4 T Cell Abs: 1168 /uL (ref 400–1790)

## 2022-07-24 LAB — HIV-1 RNA QUANT-NO REFLEX-BLD
HIV 1 RNA Quant: NOT DETECTED Copies/mL
HIV-1 RNA Quant, Log: NOT DETECTED Log cps/mL

## 2022-08-04 ENCOUNTER — Encounter: Payer: Self-pay | Admitting: Internal Medicine

## 2022-08-04 ENCOUNTER — Other Ambulatory Visit: Payer: Self-pay

## 2022-08-04 ENCOUNTER — Other Ambulatory Visit (HOSPITAL_COMMUNITY)
Admission: RE | Admit: 2022-08-04 | Discharge: 2022-08-04 | Disposition: A | Discharge status: Home / Self Care | Payer: BC Managed Care – PPO | Source: Ambulatory Visit | Attending: Internal Medicine | Admitting: Internal Medicine

## 2022-08-04 ENCOUNTER — Ambulatory Visit (INDEPENDENT_AMBULATORY_CARE_PROVIDER_SITE_OTHER): Payer: BC Managed Care – PPO | Admitting: Internal Medicine

## 2022-08-04 VITALS — BP 121/81 | HR 71 | Temp 97.0°F | Ht 72.0 in | Wt 205.0 lb

## 2022-08-04 DIAGNOSIS — Z0000 Encounter for general adult medical examination without abnormal findings: Secondary | ICD-10-CM

## 2022-08-04 DIAGNOSIS — E7800 Pure hypercholesterolemia, unspecified: Secondary | ICD-10-CM

## 2022-08-04 DIAGNOSIS — B20 Human immunodeficiency virus [HIV] disease: Secondary | ICD-10-CM | POA: Diagnosis not present

## 2022-08-04 DIAGNOSIS — Z113 Encounter for screening for infections with a predominantly sexual mode of transmission: Secondary | ICD-10-CM | POA: Diagnosis not present

## 2022-08-04 DIAGNOSIS — Z720 Tobacco use: Secondary | ICD-10-CM

## 2022-08-04 MED ORDER — BIKTARVY 50-200-25 MG PO TABS: 1.0000 | ORAL_TABLET | Freq: Every day | ORAL | 11 refills | Status: DC

## 2022-08-05 ENCOUNTER — Encounter: Payer: Self-pay | Admitting: Internal Medicine

## 2022-08-05 NOTE — Assessment & Plan Note (Signed)
I discussed with him methods for quitting smoking or vaping and that there is no one perfect method but the idea is to keep trying until you find what works.  He has tried the [place] quit line.  He will consider patches.

## 2022-08-05 NOTE — Progress Notes (Signed)
   Subjective:    Patient ID: Duane Watson, male    DOB: 1980/09/19, 42 y.o.   MRN: [id number]  HPI Marquell is here for follow up of HIV He continues on [place] and has had no missed doses.  No issues with getting or taking Biktarvy.  He is actively trying to quit smoking/vaping.  He otherwise has no complaints.     Review of Systems  Constitutional:  Negative for fatigue.  Gastrointestinal:  Negative for diarrhea.       Objective:   Physical Exam Eyes:     General: No scleral icterus. Pulmonary:     Effort: Pulmonary effort is normal.  Skin:    Findings: No rash.  Neurological:     General: No focal deficit present.     Mental Status: He is alert.   SH: + tobacco        Assessment & Plan:

## 2022-08-05 NOTE — Assessment & Plan Note (Signed)
Anal pap screening done today.

## 2022-08-05 NOTE — Assessment & Plan Note (Signed)
Doing well and labs reviewed with him.  No concerns and rtc in 6 months.   Will discuss [place] with him next visit and consider starting low-intensity statin based on the study.

## 2022-08-09 LAB — CYTOLOGY - PAP: Diagnosis: NEGATIVE

## 2022-11-25 NOTE — Progress Notes (Signed)
The 10-year ASCVD risk score (Arnett DK, et al., 2019) is: 5%   Values used to calculate the score:     Age: 42 years     Sex: Male     Is Non-Hispanic African American: Yes     Diabetic: No     Tobacco smoker: Yes     Systolic Blood Pressure: 121 mmHg     Is BP treated: No     HDL Cholesterol: 48 mg/dL     Total Cholesterol: 217 mg/dL  Sandie Ano, RN

## 2023-01-20 ENCOUNTER — Other Ambulatory Visit: Payer: BC Managed Care – PPO

## 2023-01-24 ENCOUNTER — Telehealth: Payer: Self-pay

## 2023-01-24 ENCOUNTER — Other Ambulatory Visit: Payer: Self-pay

## 2023-01-24 ENCOUNTER — Other Ambulatory Visit: Payer: BC Managed Care – PPO

## 2023-01-24 DIAGNOSIS — B20 Human immunodeficiency virus [HIV] disease: Secondary | ICD-10-CM

## 2023-01-24 DIAGNOSIS — E7800 Pure hypercholesterolemia, unspecified: Secondary | ICD-10-CM

## 2023-01-24 DIAGNOSIS — Z113 Encounter for screening for infections with a predominantly sexual mode of transmission: Secondary | ICD-10-CM

## 2023-01-24 LAB — CBC WITH DIFFERENTIAL/PLATELET
Absolute Monocytes: 270 cells/uL (ref 200–950)
Basophils Absolute: 31 cells/uL (ref 0–200)
Basophils Relative: 0.6 %
Eosinophils Absolute: 99 cells/uL (ref 15–500)
Eosinophils Relative: 1.9 %
HCT: 40.1 % (ref 38.5–50.0)
Hemoglobin: 13.6 g/dL (ref 13.2–17.1)
Lymphs Abs: 2574 cells/uL (ref 850–3900)
MCH: 32 pg (ref 27.0–33.0)
MCHC: 33.9 g/dL (ref 32.0–36.0)
MCV: 94.4 fL (ref 80.0–100.0)
MPV: 10.6 fL (ref 7.5–12.5)
Monocytes Relative: 5.2 %
Neutro Abs: 2226 cells/uL (ref 1500–7800)
Neutrophils Relative %: 42.8 %
Platelets: 300 10*3/uL (ref 140–400)
RBC: 4.25 10*6/uL (ref 4.20–5.80)
RDW: 12.6 % (ref 11.0–15.0)
Total Lymphocyte: 49.5 %
WBC: 5.2 10*3/uL (ref 3.8–10.8)

## 2023-01-24 NOTE — Telephone Encounter (Signed)
Verified with [facility] and patient has refills on file. Patient informed.  Duane Watson T [name]

## 2023-01-24 NOTE — Telephone Encounter (Signed)
Patient is requesting refill on Biktarvy to [facility] on [address], he was told he no longer had refills. Best contact number [phone]

## 2023-02-03 ENCOUNTER — Ambulatory Visit: Payer: BC Managed Care – PPO | Admitting: Internal Medicine

## 2023-03-01 ENCOUNTER — Ambulatory Visit: Payer: BC Managed Care – PPO | Admitting: Internal Medicine

## 2023-05-11 ENCOUNTER — Other Ambulatory Visit: Payer: Self-pay

## 2023-05-11 ENCOUNTER — Encounter: Payer: Self-pay | Admitting: Internal Medicine

## 2023-05-11 ENCOUNTER — Ambulatory Visit: Payer: BC Managed Care – PPO | Admitting: Internal Medicine

## 2023-05-11 VITALS — BP 113/74 | HR 82 | Temp 97.6°F | Ht 72.0 in | Wt 209.0 lb

## 2023-05-11 DIAGNOSIS — Z5181 Encounter for therapeutic drug level monitoring: Secondary | ICD-10-CM

## 2023-05-11 DIAGNOSIS — F17290 Nicotine dependence, other tobacco product, uncomplicated: Secondary | ICD-10-CM | POA: Diagnosis not present

## 2023-05-11 DIAGNOSIS — B20 Human immunodeficiency virus [HIV] disease: Secondary | ICD-10-CM

## 2023-05-11 DIAGNOSIS — Z113 Encounter for screening for infections with a predominantly sexual mode of transmission: Secondary | ICD-10-CM

## 2023-05-11 DIAGNOSIS — Z720 Tobacco use: Secondary | ICD-10-CM

## 2023-05-11 MED ORDER — BIKTARVY 50-200-25 MG PO TABS
1.0000 | ORAL_TABLET | Freq: Every day | ORAL | 11 refills | Status: DC
Start: 2023-05-11 — End: 2023-10-31

## 2023-05-11 NOTE — Assessment & Plan Note (Addendum)
He continues to do well and recent labs reivewed with him.  No changes indicated and he will rtc in 6 months.  Refills provided Declined vaccines

## 2023-05-11 NOTE — Assessment & Plan Note (Signed)
Discussed cessation completely and he will work on this

## 2023-05-11 NOTE — Assessment & Plan Note (Signed)
Creat, LFTs wnl.

## 2023-05-11 NOTE — Assessment & Plan Note (Signed)
Declined further screening or anal PAP at this time.

## 2023-05-11 NOTE — Progress Notes (Signed)
   Subjective:    Patient ID: Duane Watson, male    DOB: 04/25/1981, 42 y.o.   MRN: [id number]  HPI Duane Watson is here for follow up of HIV He was last seen in February and doing well and had labs again in August but did not follow up, though labs all good.  He has had no issues with getting or taking Biktarvy.     Review of Systems  Constitutional:  Negative for fatigue.  Gastrointestinal:  Negative for diarrhea and nausea.       Objective:   Physical Exam Eyes:     General: No scleral icterus. Pulmonary:     Effort: Pulmonary effort is normal.  Neurological:     Mental Status: He is alert.   SH: quit smoking but vaping in its place        Assessment & Plan:

## 2023-10-19 ENCOUNTER — Ambulatory Visit: Payer: BC Managed Care – PPO | Admitting: Internal Medicine

## 2023-10-31 ENCOUNTER — Encounter: Payer: Self-pay | Admitting: Internal Medicine

## 2023-10-31 ENCOUNTER — Other Ambulatory Visit: Payer: Self-pay

## 2023-10-31 ENCOUNTER — Ambulatory Visit: Admitting: Internal Medicine

## 2023-10-31 VITALS — BP 111/72 | HR 78 | Resp 16 | Ht 72.0 in | Wt 209.0 lb

## 2023-10-31 DIAGNOSIS — Z21 Asymptomatic human immunodeficiency virus [HIV] infection status: Secondary | ICD-10-CM | POA: Diagnosis not present

## 2023-10-31 DIAGNOSIS — F17290 Nicotine dependence, other tobacco product, uncomplicated: Secondary | ICD-10-CM

## 2023-10-31 DIAGNOSIS — B20 Human immunodeficiency virus [HIV] disease: Secondary | ICD-10-CM | POA: Diagnosis not present

## 2023-10-31 MED ORDER — BIKTARVY 50-200-25 MG PO TABS
1.0000 | ORAL_TABLET | Freq: Every day | ORAL | 11 refills | Status: DC
Start: 2023-10-31 — End: 2024-04-16

## 2023-10-31 NOTE — Progress Notes (Unsigned)
 [facility]     HPI: Duane Watson is a 43 y.o. male presents for HIV management on biktarvy  well controlled  Date of diagnosis: July 2018 ART exposure biktarvy  Past Ois none Risk factors: MSM Partners in last 3 months one Anal sex receptive yes  Social: Occupation: hospice aid Housing: house, by himself Support: faily Understanding of HIV: Etoh/drug/tobacco use: Socail/ mj/ vape Past Medical History:  Diagnosis Date   HIV (human immunodeficiency virus infection) (HCC)    Vaccine counseling 02/15/2017    Past Surgical History:  Procedure Laterality Date   NO PAST SURGERIES      Family History  Problem Relation Age of Onset   Hypertension Mother    Hypertension Sister    Cancer Brother 46       ear cancer   Kidney failure Maternal Grandmother    Current Outpatient Medications on File Prior to Visit  Medication Sig Dispense Refill   bictegravir-emtricitabine-tenofovir AF (BIKTARVY ) 50-200-25 MG TABS tablet Take 1 tablet by mouth daily. 30 tablet 11   No current facility-administered medications on file prior to visit.    No Known Allergies    Lab Results HIV 1 RNA Quant (Copies/mL)  Date Value  01/24/2023 Not Detected  07/21/2022 Not Detected  01/29/2022 Not Detected   CD4 T Cell Abs (/uL)  Date Value  01/24/2023 1,004  07/21/2022 1,168  11/19/2020 1,259   No results found for: "HIV1GENOSEQ" Lab Results  Component Value Date   WBC 5.2 01/24/2023   HGB 13.6 01/24/2023   HCT 40.1 01/24/2023   MCV 94.4 01/24/2023   PLT 300 01/24/2023    Lab Results  Component Value Date   CREATININE 1.29 01/24/2023   BUN 12 01/24/2023   NA 141 01/24/2023   K 3.9 01/24/2023   CL 106 01/24/2023   CO2 28 01/24/2023   Lab Results  Component Value Date   ALT 15 01/24/2023   AST 18 01/24/2023   ALKPHOS 55 10/26/2021   BILITOT 0.9 01/24/2023    Lab Results  Component Value Date   CHOL 190 01/24/2023   TRIG 218 (H)  01/24/2023   HDL 42 01/24/2023   LDLCALC 114 (H) 01/24/2023   Lab Results  Component Value Date   HAV REACTIVE (A) 02/15/2017   Lab Results  Component Value Date   HEPBSAG NON-REACTIVE 02/15/2017   HEPBSAB REACTIVE (A) 02/15/2017   Lab Results  Component Value Date   HCVAB NON-REACTIVE 02/15/2017   Lab Results  Component Value Date   CHLAMYDIAWP Negative 01/29/2022   CHLAMYDIAWP Negative 01/29/2022   CHLAMYDIAWP Negative 01/29/2022   N Negative 01/29/2022   N Negative 01/29/2022   N Negative 01/29/2022   No results found for: "GCPROBEAPT" Lab Results  Component Value Date   QUANTGOLD NEGATIVE 02/15/2017    Assessment/Plan #HIV/Asymptomatic -CD4 1k, Vlnd , on 01/24/23 Labs today F/u 6 months      #Vaccination COVID Flu Monkeypox utd PCV-20 due->declined Meningitis HepA immune HEpB immune Tdap declined Shingles   #vapes -trying to quit -declnied patch  #Health maintenance -Quantiferon today -RPR negative 01/24/23 -HCV today -GC declined -Lipid The 10-year ASCVD risk score (Arnett DK, et al., 2019) is: 2.6%   Values used to calculate the score:     Age: 40 years     Sex: Male     Is Non-Hispanic African American: Yes     Diabetic: No     Tobacco smoker: No  Systolic Blood Pressure: 111 mmHg     Is BP treated: No     HDL Cholesterol: 42 mg/dL     Total Cholesterol: 190 mg/dL  -Dysplasia screen M-> next visit -Colonoscopy    Orlie Bjornstad, MD [facility] I have personally spent 45 minutes involved in face-to-face and non-face-to-face activities for this patient on the day of the visit. Professional time spent includes the following activities: Preparing to see the patient (review of tests), Obtaining and/or reviewing separately obtained history (admission/discharge record), Performing a medically appropriate examination and/or evaluation , Ordering medications/tests/procedures,  referring and communicating with other health care professionals, Documenting clinical information in the EMR, Independently interpreting results (not separately reported), Communicating results to the patient/family/caregiver, Counseling and educating the patient/family/caregiver and Care coordination (not separately reported).

## 2023-10-31 NOTE — Patient Instructions (Signed)
 Smoking Cessation: Quitline[place] 1-800-QUIT-NOW 707-701-6721); Espaol: 1-855-Djelo-Ya (1-780-445-4976) [email or web address]

## 2023-11-02 LAB — COMPLETE METABOLIC PANEL WITHOUT GFR
AG Ratio: 1.3 (calc) (ref 1.0–2.5)
ALT: 23 U/L (ref 9–46)
AST: 22 U/L (ref 10–40)
Albumin: 3.9 g/dL (ref 3.6–5.1)
Alkaline phosphatase (APISO): 54 U/L (ref 36–130)
BUN/Creatinine Ratio: 9 (calc) (ref 6–22)
BUN: 12 mg/dL (ref 7–25)
CO2: 24 mmol/L (ref 20–32)
Calcium: 9.2 mg/dL (ref 8.6–10.3)
Chloride: 106 mmol/L (ref 98–110)
Creat: 1.33 mg/dL — ABNORMAL HIGH (ref 0.60–1.29)
Globulin: 3 g/dL (ref 1.9–3.7)
Glucose, Bld: 90 mg/dL (ref 65–99)
Potassium: 4.1 mmol/L (ref 3.5–5.3)
Sodium: 139 mmol/L (ref 135–146)
Total Bilirubin: 0.8 mg/dL (ref 0.2–1.2)
Total Protein: 6.9 g/dL (ref 6.1–8.1)

## 2023-11-02 LAB — CBC WITH DIFFERENTIAL/PLATELET
Absolute Lymphocytes: 2807 {cells}/uL (ref 850–3900)
Absolute Monocytes: 290 {cells}/uL (ref 200–950)
Basophils Absolute: 17 {cells}/uL (ref 0–200)
Basophils Relative: 0.3 %
Eosinophils Absolute: 41 {cells}/uL (ref 15–500)
Eosinophils Relative: 0.7 %
HCT: 42.4 % (ref 38.5–50.0)
Hemoglobin: 14.4 g/dL (ref 13.2–17.1)
MCH: 32.3 pg (ref 27.0–33.0)
MCHC: 34 g/dL (ref 32.0–36.0)
MCV: 95.1 fL (ref 80.0–100.0)
MPV: 10.4 fL (ref 7.5–12.5)
Monocytes Relative: 5 %
Neutro Abs: 2645 {cells}/uL (ref 1500–7800)
Neutrophils Relative %: 45.6 %
Platelets: 319 10*3/uL (ref 140–400)
RBC: 4.46 10*6/uL (ref 4.20–5.80)
RDW: 13 % (ref 11.0–15.0)
Total Lymphocyte: 48.4 %
WBC: 5.8 10*3/uL (ref 3.8–10.8)

## 2023-11-02 LAB — HIV-1 RNA QUANT-NO REFLEX-BLD
HIV 1 RNA Quant: <20 {copies}/mL — AB
HIV-1 RNA Quant, Log: <1.3 {Log_copies}/mL — AB

## 2023-11-02 LAB — T-HELPER CELLS (CD4) COUNT (NOT AT ARMC)
Absolute CD4: 1258 {cells}/uL (ref 490–1740)
CD4 T Helper %: 39 % (ref 30–61)
Total lymphocyte count: 3194 {cells}/uL (ref 850–3900)

## 2024-04-16 ENCOUNTER — Other Ambulatory Visit (HOSPITAL_COMMUNITY)
Admission: RE | Admit: 2024-04-16 | Discharge: 2024-04-16 | Disposition: A | Discharge status: Home / Self Care | Source: Ambulatory Visit | Attending: Internal Medicine | Admitting: Internal Medicine

## 2024-04-16 ENCOUNTER — Encounter: Payer: Self-pay | Admitting: Internal Medicine

## 2024-04-16 ENCOUNTER — Other Ambulatory Visit: Payer: Self-pay

## 2024-04-16 ENCOUNTER — Ambulatory Visit: Admitting: Internal Medicine

## 2024-04-16 VITALS — BP 109/63 | HR 74 | Temp 99.0°F | Wt 211.2 lb

## 2024-04-16 DIAGNOSIS — Z23 Encounter for immunization: Secondary | ICD-10-CM

## 2024-04-16 DIAGNOSIS — B20 Human immunodeficiency virus [HIV] disease: Secondary | ICD-10-CM | POA: Insufficient documentation

## 2024-04-16 DIAGNOSIS — Z113 Encounter for screening for infections with a predominantly sexual mode of transmission: Secondary | ICD-10-CM | POA: Diagnosis not present

## 2024-04-16 MED ORDER — BIKTARVY 50-200-25 MG PO TABS: 1.0000 | ORAL_TABLET | Freq: Every day | ORAL | 11 refills | Status: AC

## 2024-04-16 MED ORDER — ATORVASTATIN CALCIUM 10 MG PO TABS: 10.0000 mg | ORAL_TABLET | Freq: Every day | ORAL | 5 refills | Status: AC

## 2024-04-16 NOTE — Progress Notes (Signed)
 [facility]     HPI: Duane Watson is a 43 y.o. male male presents for HIV management on biktarvy  well controlled Today; one missed dose since LV, not sexaully active last visit.  Date of diagnosis: July 2018 ART exposure biktarvy  Past Ois none Risk factors: MSM Partners in last 3 months one Anal sex receptive yes   Social: Occupation: hospice aid Housing: house, by himself Support: faily Understanding of HIV: Etoh/drug/tobacco use: Socail/ mj/ vape Past Medical History:  Diagnosis Date   HIV (human immunodeficiency virus infection) (HCC)    Vaccine counseling 02/15/2017    Past Surgical History:  Procedure Laterality Date   NO PAST SURGERIES      Family History  Problem Relation Age of Onset   Hypertension Mother    Hypertension Sister    Cancer Brother 5       ear cancer   Kidney failure Maternal Grandmother    Current Outpatient Medications on File Prior to Visit  Medication Sig Dispense Refill   bictegravir-emtricitabine-tenofovir AF (BIKTARVY ) 50-200-25 MG TABS tablet Take 1 tablet by mouth daily. 30 tablet 11   No current facility-administered medications on file prior to visit.    No Known Allergies    Lab Results HIV 1 RNA Quant  Date Value  10/31/2023 <20 DETECTED copies/mL (A)  01/24/2023 Not Detected Copies/mL  07/21/2022 Not Detected Copies/mL   CD4 T Cell Abs (/uL)  Date Value  01/24/2023 1,004  07/21/2022 1,168  11/19/2020 1,259   No results found for: HIV1GENOSEQ Lab Results  Component Value Date   WBC 5.8 10/31/2023   HGB 14.4 10/31/2023   HCT 42.4 10/31/2023   MCV 95.1 10/31/2023   PLT 319 10/31/2023    Lab Results  Component Value Date   CREATININE 1.33 (H) 10/31/2023   BUN 12 10/31/2023   NA 139 10/31/2023   K 4.1 10/31/2023   CL 106 10/31/2023   CO2 24 10/31/2023   Lab Results  Component Value Date   ALT 23 10/31/2023   AST 22 10/31/2023   ALKPHOS 55 10/26/2021   BILITOT 0.8  10/31/2023    Lab Results  Component Value Date   CHOL 190 01/24/2023   TRIG 218 (H) 01/24/2023   HDL 42 01/24/2023   LDLCALC 114 (H) 01/24/2023   Lab Results  Component Value Date   HAV REACTIVE (A) 02/15/2017   Lab Results  Component Value Date   HEPBSAG NON-REACTIVE 02/15/2017   HEPBSAB REACTIVE (A) 02/15/2017   Lab Results  Component Value Date   HCVAB NON-REACTIVE 02/15/2017   Lab Results  Component Value Date   CHLAMYDIAWP Negative 01/29/2022   CHLAMYDIAWP Negative 01/29/2022   CHLAMYDIAWP Negative 01/29/2022   N Negative 01/29/2022   N Negative 01/29/2022   N Negative 01/29/2022   No results found for: GCPROBEAPT Lab Results  Component Value Date   QUANTGOLD NEGATIVE 02/15/2017    Assessment/Plan #HIV/Asymptomatic -CD4 1k, Vlnd , 5/25 Lab visit in a month for cmp.s tatin started today  f/u with me in 6 months           #Vaccination COVID declined Flu today Monkeypox utd PCV-20 due->declined Meningitis HepA immune HEpB immune Tdap declined Shingles     #vapes -trying to quit -declnied patch   #Health maintenance -Quantiferon today  11/3 -RPR negative 01/24/23, today 11/3 -HCV today 11/3 -GC urine today -Lipid today The 10-year ASCVD risk score (Arnett DK, et al., 2019) is: 4.7%  Values used to calculate the score:     Age: 89 years     Clincally relevant sex: Male     Is Non-Hispanic African American: Yes     Diabetic: No     Tobacco smoker: Yes     Systolic Blood Pressure: 109 mmHg     Is BP treated: No     HDL Cholesterol: 42 mg/dL     Total Cholesterol: 190 mg/dL  -Dysplasia screen M-> today -Colonoscopy     Loney Stank, MD [facility] I have personally spent 52 minutes involved in face-to-face and non-face-to-face activities for this patient on the day of the visit. Professional time spent includes the following activities: Preparing to see the patient (review of  tests), Obtaining and/or reviewing separately obtained history (admission/discharge record), Performing a medically appropriate examination and/or evaluation , Ordering medications/tests/procedures, referring and communicating with other health care professionals, Documenting clinical information in the EMR, Independently interpreting results (not separately reported), Communicating results to the patient/family/caregiver, Counseling and educating the patient/family/caregiver and Care coordination (not separately reported).

## 2024-04-16 NOTE — Patient Instructions (Signed)
 Lab visit in a month for cmp  f/u with me in 6 months

## 2024-04-17 LAB — T-HELPER CELLS (CD4) COUNT (NOT AT ARMC)
CD4 % Helper T Cell: 43 % (ref 33–65)
CD4 T Cell Abs: 949 /uL (ref 400–1790)

## 2024-04-18 LAB — CYTOLOGY - PAP
Comment: NEGATIVE
Diagnosis: NEGATIVE
Diagnosis: REACTIVE
High risk HPV: NEGATIVE

## 2024-04-20 LAB — CBC WITH DIFFERENTIAL/PLATELET
Absolute Lymphocytes: 2586 {cells}/uL (ref 850–3900)
Absolute Monocytes: 252 {cells}/uL (ref 200–950)
Basophils Absolute: 42 {cells}/uL (ref 0–200)
Basophils Relative: 0.7 %
Eosinophils Absolute: 42 {cells}/uL (ref 15–500)
Eosinophils Relative: 0.7 %
HCT: 40.9 % (ref 38.5–50.0)
Hemoglobin: 14.2 g/dL (ref 13.2–17.1)
MCH: 33.3 pg — ABNORMAL HIGH (ref 27.0–33.0)
MCHC: 34.7 g/dL (ref 32.0–36.0)
MCV: 95.8 fL (ref 80.0–100.0)
MPV: 10.2 fL (ref 7.5–12.5)
Monocytes Relative: 4.2 %
Neutro Abs: 3078 {cells}/uL (ref 1500–7800)
Neutrophils Relative %: 51.3 %
Platelets: 327 Thousand/uL (ref 140–400)
RBC: 4.27 Million/uL (ref 4.20–5.80)
RDW: 12.5 % (ref 11.0–15.0)
Total Lymphocyte: 43.1 %
WBC: 6 Thousand/uL (ref 3.8–10.8)

## 2024-04-20 LAB — COMPLETE METABOLIC PANEL WITHOUT GFR
AG Ratio: 1.2 (calc) (ref 1.0–2.5)
ALT: 17 U/L (ref 9–46)
AST: 22 U/L (ref 10–40)
Albumin: 3.8 g/dL (ref 3.6–5.1)
Alkaline phosphatase (APISO): 55 U/L (ref 36–130)
BUN: 13 mg/dL (ref 7–25)
CO2: 22 mmol/L (ref 20–32)
Calcium: 8.6 mg/dL (ref 8.6–10.3)
Chloride: 105 mmol/L (ref 98–110)
Creat: 1.09 mg/dL (ref 0.60–1.29)
Globulin: 3.3 g/dL (ref 1.9–3.7)
Glucose, Bld: 117 mg/dL — ABNORMAL HIGH (ref 65–99)
Potassium: 3.5 mmol/L (ref 3.5–5.3)
Sodium: 138 mmol/L (ref 135–146)
Total Bilirubin: 0.9 mg/dL (ref 0.2–1.2)
Total Protein: 7.1 g/dL (ref 6.1–8.1)

## 2024-04-20 LAB — LIPID PANEL
Cholesterol: 253 mg/dL — ABNORMAL HIGH (ref <200)
HDL: 48 mg/dL (ref >=40)
LDL Cholesterol (Calc): 168 mg/dL — ABNORMAL HIGH
Non-HDL Cholesterol (Calc): 205 mg/dL — ABNORMAL HIGH (ref <130)
Total CHOL/HDL Ratio: 5.3 (calc) — ABNORMAL HIGH (ref <5.0)
Triglycerides: 209 mg/dL — ABNORMAL HIGH (ref <150)

## 2024-04-20 LAB — HEPATITIS C ANTIBODY: Hepatitis C Ab: NONREACTIVE

## 2024-04-20 LAB — HIV-1 RNA QUANT-NO REFLEX-BLD
HIV 1 RNA Quant: NOT DETECTED {copies}/mL
HIV-1 RNA Quant, Log: NOT DETECTED {Log_copies}/mL

## 2024-04-20 LAB — RPR TITER: RPR Titer: 1:1 {titer} — ABNORMAL HIGH

## 2024-04-20 LAB — RPR: RPR Ser Ql: REACTIVE — AB

## 2024-04-20 LAB — T PALLIDUM AB: T Pallidum Abs: NEGATIVE

## 2024-05-15 ENCOUNTER — Other Ambulatory Visit: Payer: Self-pay

## 2024-05-15 DIAGNOSIS — B20 Human immunodeficiency virus [HIV] disease: Secondary | ICD-10-CM

## 2024-05-16 ENCOUNTER — Other Ambulatory Visit

## 2024-05-16 ENCOUNTER — Other Ambulatory Visit: Payer: Self-pay

## 2024-05-16 DIAGNOSIS — B20 Human immunodeficiency virus [HIV] disease: Secondary | ICD-10-CM

## 2024-05-16 LAB — COMPLETE METABOLIC PANEL WITHOUT GFR
AG Ratio: 1.2 (calc) (ref 1.0–2.5)
ALT: 13 U/L (ref 9–46)
AST: 17 U/L (ref 10–40)
Albumin: 3.7 g/dL (ref 3.6–5.1)
Alkaline phosphatase (APISO): 62 U/L (ref 36–130)
BUN: 11 mg/dL (ref 7–25)
CO2: 26 mmol/L (ref 20–32)
Calcium: 9 mg/dL (ref 8.6–10.3)
Chloride: 107 mmol/L (ref 98–110)
Creat: 1.24 mg/dL (ref 0.60–1.29)
Globulin: 3 g/dL (ref 1.9–3.7)
Glucose, Bld: 85 mg/dL (ref 65–99)
Potassium: 4.4 mmol/L (ref 3.5–5.3)
Sodium: 142 mmol/L (ref 135–146)
Total Bilirubin: 0.6 mg/dL (ref 0.2–1.2)
Total Protein: 6.7 g/dL (ref 6.1–8.1)

## 2024-05-18 ENCOUNTER — Ambulatory Visit: Payer: Self-pay | Admitting: Internal Medicine

## 2024-10-29 ENCOUNTER — Ambulatory Visit: Admitting: Internal Medicine
